# Patient Record
Sex: Female | Born: 1955
Health system: Southern US, Community
[De-identification: ages and names within clinical notes are randomized; demographics above are authoritative.]

## PROBLEM LIST (undated history)

## (undated) DIAGNOSIS — K635 Polyp of colon: Secondary | ICD-10-CM

## (undated) DIAGNOSIS — F419 Anxiety disorder, unspecified: Secondary | ICD-10-CM

## (undated) DIAGNOSIS — I10 Essential (primary) hypertension: Secondary | ICD-10-CM

## (undated) DIAGNOSIS — R011 Cardiac murmur, unspecified: Secondary | ICD-10-CM

## (undated) DIAGNOSIS — F32A Depression, unspecified: Secondary | ICD-10-CM

## (undated) DIAGNOSIS — T7840XA Allergy, unspecified, initial encounter: Secondary | ICD-10-CM

## (undated) DIAGNOSIS — M8580 Other specified disorders of bone density and structure, unspecified site: Secondary | ICD-10-CM

## (undated) DIAGNOSIS — Z860100 Personal history of colon polyps, unspecified: Secondary | ICD-10-CM

## (undated) DIAGNOSIS — G43909 Migraine, unspecified, not intractable, without status migrainosus: Secondary | ICD-10-CM

## (undated) DIAGNOSIS — J301 Allergic rhinitis due to pollen: Secondary | ICD-10-CM

## (undated) DIAGNOSIS — K219 Gastro-esophageal reflux disease without esophagitis: Secondary | ICD-10-CM

## (undated) DIAGNOSIS — R51 Headache: Secondary | ICD-10-CM

## (undated) DIAGNOSIS — D219 Benign neoplasm of connective and other soft tissue, unspecified: Secondary | ICD-10-CM

## (undated) DIAGNOSIS — R519 Headache, unspecified: Secondary | ICD-10-CM

## (undated) DIAGNOSIS — F329 Major depressive disorder, single episode, unspecified: Secondary | ICD-10-CM

## (undated) HISTORY — PX: OTHER SURGICAL HISTORY: SHX169

## (undated) HISTORY — PX: FOOT SURGERY: SHX648

## (undated) HISTORY — DX: Migraine, unspecified, not intractable, without status migrainosus: G43.909

## (undated) HISTORY — DX: Allergy, unspecified, initial encounter: T78.40XA

## (undated) HISTORY — DX: Personal history of colon polyps, unspecified: Z86.0100

## (undated) HISTORY — DX: Benign neoplasm of connective and other soft tissue, unspecified: D21.9

## (undated) HISTORY — PX: BREAST BIOPSY: SHX20

## (undated) HISTORY — DX: Personal history of colonic polyps: Z86.010

## (undated) HISTORY — DX: Cardiac murmur, unspecified: R01.1

## (undated) HISTORY — DX: Other specified disorders of bone density and structure, unspecified site: M85.80

## (undated) HISTORY — DX: Allergic rhinitis due to pollen: J30.1

## (undated) HISTORY — DX: Polyp of colon: K63.5

---

## 1997-09-05 HISTORY — PX: ABDOMINAL HYSTERECTOMY: SHX81

## 2004-10-22 ENCOUNTER — Ambulatory Visit: Payer: Self-pay | Admitting: Internal Medicine

## 2005-01-19 ENCOUNTER — Ambulatory Visit (HOSPITAL_COMMUNITY): Admission: RE | Admit: 2005-01-19 | Discharge: 2005-01-19 | Payer: Self-pay | Admitting: Sports Medicine

## 2005-03-10 ENCOUNTER — Ambulatory Visit: Payer: Self-pay | Admitting: Internal Medicine

## 2005-03-24 ENCOUNTER — Ambulatory Visit: Payer: Self-pay | Admitting: Internal Medicine

## 2005-04-29 ENCOUNTER — Ambulatory Visit: Payer: Self-pay | Admitting: Internal Medicine

## 2005-10-24 ENCOUNTER — Ambulatory Visit: Payer: Self-pay | Admitting: *Deleted

## 2005-11-21 ENCOUNTER — Ambulatory Visit: Payer: Self-pay | Admitting: Family Medicine

## 2006-11-21 ENCOUNTER — Ambulatory Visit: Payer: Self-pay | Admitting: Family Medicine

## 2006-12-27 ENCOUNTER — Ambulatory Visit: Payer: Self-pay | Admitting: Family Medicine

## 2006-12-27 LAB — CONVERTED CEMR LAB
ALT: 14 units/L (ref 0–40)
Albumin: 3.5 g/dL (ref 3.5–5.2)
Alkaline Phosphatase: 66 units/L (ref 39–117)
BUN: 12 mg/dL (ref 6–23)
Basophils Absolute: 0 10*3/uL (ref 0.0–0.1)
Bilirubin, Direct: 0.1 mg/dL (ref 0.0–0.3)
CO2: 34 meq/L — ABNORMAL HIGH (ref 19–32)
Calcium: 9.2 mg/dL (ref 8.4–10.5)
Eosinophils Absolute: 0.1 10*3/uL (ref 0.0–0.6)
Eosinophils Relative: 2.7 % (ref 0.0–5.0)
GFR calc Af Amer: 98 mL/min
Glucose, Bld: 89 mg/dL (ref 70–99)
HDL: 72.2 mg/dL (ref >39.0)
MCV: 90.3 fL (ref 78.0–100.0)
Monocytes Absolute: 0.5 10*3/uL (ref 0.2–0.7)
RBC: 4.38 M/uL (ref 3.87–5.11)
TSH: 0.49 microintl units/mL (ref 0.35–5.50)
Total Bilirubin: 0.7 mg/dL (ref 0.3–1.2)
Total CHOL/HDL Ratio: 2.4
Total Protein: 6.5 g/dL (ref 6.0–8.3)
VLDL: 18 mg/dL (ref 0–40)

## 2007-01-11 ENCOUNTER — Ambulatory Visit: Payer: Self-pay | Admitting: Family Medicine

## 2007-01-11 ENCOUNTER — Ambulatory Visit: Payer: Self-pay

## 2007-01-22 DIAGNOSIS — F341 Dysthymic disorder: Secondary | ICD-10-CM

## 2007-02-05 ENCOUNTER — Ambulatory Visit: Payer: Self-pay

## 2007-02-05 ENCOUNTER — Encounter: Payer: Self-pay | Admitting: Family Medicine

## 2007-02-08 ENCOUNTER — Encounter (INDEPENDENT_AMBULATORY_CARE_PROVIDER_SITE_OTHER): Payer: Self-pay | Admitting: *Deleted

## 2007-03-19 ENCOUNTER — Ambulatory Visit: Payer: Self-pay | Admitting: Family Medicine

## 2007-03-19 DIAGNOSIS — I10 Essential (primary) hypertension: Secondary | ICD-10-CM

## 2007-03-19 DIAGNOSIS — R51 Headache: Secondary | ICD-10-CM

## 2007-03-19 DIAGNOSIS — F411 Generalized anxiety disorder: Secondary | ICD-10-CM | POA: Insufficient documentation

## 2007-03-19 DIAGNOSIS — R519 Headache, unspecified: Secondary | ICD-10-CM | POA: Insufficient documentation

## 2007-03-21 ENCOUNTER — Ambulatory Visit: Payer: Self-pay | Admitting: Family Medicine

## 2007-03-22 LAB — CONVERTED CEMR LAB
BUN: 9 mg/dL (ref 6–23)
Calcium: 9.7 mg/dL (ref 8.4–10.5)
Chloride: 103 meq/L (ref 96–112)
Glucose, Bld: 84 mg/dL (ref 70–99)
Potassium: 3.6 meq/L (ref 3.5–5.1)

## 2007-07-12 ENCOUNTER — Telehealth (INDEPENDENT_AMBULATORY_CARE_PROVIDER_SITE_OTHER): Payer: Self-pay | Admitting: *Deleted

## 2013-01-09 ENCOUNTER — Other Ambulatory Visit: Payer: Self-pay | Admitting: Geriatric Medicine

## 2013-01-09 MED ORDER — PREGABALIN 25 MG PO CAPS: 25.0000 mg | ORAL_CAPSULE | Freq: Two times a day (BID) | ORAL | Status: DC

## 2015-01-01 ENCOUNTER — Encounter: Payer: Self-pay | Admitting: Internal Medicine

## 2015-12-16 ENCOUNTER — Telehealth (HOSPITAL_COMMUNITY): Payer: Self-pay | Admitting: *Deleted

## 2015-12-16 NOTE — Telephone Encounter (Signed)
Telephoned patient at home # and left message to return call to [facility] 

## 2015-12-29 ENCOUNTER — Telehealth (HOSPITAL_COMMUNITY): Payer: Self-pay | Admitting: *Deleted

## 2015-12-29 NOTE — Telephone Encounter (Signed)
Telephoned patient at home # and left message to return call to [facility] 

## 2016-01-04 ENCOUNTER — Other Ambulatory Visit (HOSPITAL_COMMUNITY): Payer: Self-pay | Admitting: *Deleted

## 2016-01-04 DIAGNOSIS — N6320 Unspecified lump in the left breast, unspecified quadrant: Secondary | ICD-10-CM

## 2016-01-21 ENCOUNTER — Encounter (HOSPITAL_COMMUNITY): Payer: Self-pay

## 2016-01-21 ENCOUNTER — Ambulatory Visit
Admission: RE | Admit: 2016-01-21 | Discharge: 2016-01-21 | Disposition: A | Discharge status: Home / Self Care | Payer: No Typology Code available for payment source | Source: Ambulatory Visit | Attending: Obstetrics and Gynecology | Admitting: Obstetrics and Gynecology

## 2016-01-21 ENCOUNTER — Other Ambulatory Visit (HOSPITAL_COMMUNITY): Payer: Self-pay | Admitting: Obstetrics and Gynecology

## 2016-01-21 ENCOUNTER — Ambulatory Visit (HOSPITAL_COMMUNITY)
Admission: RE | Admit: 2016-01-21 | Discharge: 2016-01-21 | Disposition: A | Discharge status: Home / Self Care | Payer: Self-pay | Source: Ambulatory Visit | Attending: Obstetrics and Gynecology | Admitting: Obstetrics and Gynecology

## 2016-01-21 VITALS — BP 142/100 | Temp 98.1°F | Ht 64.0 in | Wt 127.4 lb

## 2016-01-21 DIAGNOSIS — N6320 Unspecified lump in the left breast, unspecified quadrant: Secondary | ICD-10-CM

## 2016-01-21 DIAGNOSIS — N6324 Unspecified lump in the left breast, lower inner quadrant: Secondary | ICD-10-CM

## 2016-01-21 DIAGNOSIS — Z1239 Encounter for other screening for malignant neoplasm of breast: Secondary | ICD-10-CM

## 2016-01-21 DIAGNOSIS — N6310 Unspecified lump in the right breast, unspecified quadrant: Secondary | ICD-10-CM

## 2016-01-21 HISTORY — DX: Essential (primary) hypertension: I10

## 2016-01-21 NOTE — Patient Instructions (Signed)
Educational materials on self breast awareness given. Explained to Casey Frazier that she did not need a Pap smear today due to her history of a hysterectomy for benign reasons. Told patient that she does not need any further Pap smears due to her history of a hysterectomy for benign reasons. Referred patient to the [facility] for diagnostic mammogram. Appointment scheduled for Thursday, Jan 21, 2016 at 1410. Casey Frazier verbalized understanding.  Sola Margolis, Arvil Chaco, RN 2:10 PM

## 2016-01-21 NOTE — Progress Notes (Signed)
Complaints of a left breast lump x 10-15 years that hasn't increased in size. Patient states she thinks is has decreased in size.  Pap Smear:  Pap smear not completed today. Last Pap smear was in 2016 at Dr. Bettey Mare office and normal. Per patient has no history of an abnormal Pap smear. Patient has a history of a hysterectomy for fibroids in 1998. Patient no longer needs Pap smears due to her history of a hysterectomy for benign reasons per [facility] and ACOG guidelines. No Pap smear results are in EPIC.  Physical exam: Breasts Breasts symmetrical. No skin abnormalities bilateral breasts. No nipple retraction bilateral breasts. No nipple discharge bilateral breasts. No lymphadenopathy. No lumps palpated right breast. Palpated a lump within the left breast at 7 o'clock next to areola. No complaints of pain or tenderness on exam. Referred patient to the [facility] for diagnostic mammogram. Appointment scheduled for Thursday, Jan 21, 2016 at 1410.   Pelvic/Bimanual No Pap smear completed today since patient has a history of a hysterectomy for benign reasons. Pap smear not indicated per [facility] guidelines.   Smoking History: Patient has never smoked.  Patient Navigation: Patient education provided. Access to services provided for patient through [facility] program.   Colorectal Cancer Screening: Patient had a colonoscopy completed 03/24/2005. No complaints today.

## 2016-02-05 ENCOUNTER — Encounter (HOSPITAL_COMMUNITY): Payer: Self-pay | Admitting: *Deleted

## 2016-02-12 ENCOUNTER — Other Ambulatory Visit (HOSPITAL_COMMUNITY): Payer: Self-pay | Admitting: Obstetrics and Gynecology

## 2016-02-12 DIAGNOSIS — N6320 Unspecified lump in the left breast, unspecified quadrant: Secondary | ICD-10-CM

## 2016-02-15 ENCOUNTER — Ambulatory Visit
Admission: RE | Admit: 2016-02-15 | Discharge: 2016-02-15 | Disposition: A | Discharge status: Home / Self Care | Payer: No Typology Code available for payment source | Source: Ambulatory Visit | Attending: Obstetrics and Gynecology | Admitting: Obstetrics and Gynecology

## 2016-02-15 ENCOUNTER — Other Ambulatory Visit: Payer: No Typology Code available for payment source

## 2016-02-15 DIAGNOSIS — N6320 Unspecified lump in the left breast, unspecified quadrant: Secondary | ICD-10-CM

## 2016-11-10 ENCOUNTER — Other Ambulatory Visit: Payer: Self-pay | Admitting: Obstetrics and Gynecology

## 2016-11-10 DIAGNOSIS — N6320 Unspecified lump in the left breast, unspecified quadrant: Secondary | ICD-10-CM

## 2016-11-15 ENCOUNTER — Ambulatory Visit
Admission: RE | Admit: 2016-11-15 | Discharge: 2016-11-15 | Disposition: A | Discharge status: Home / Self Care | Payer: BLUE CROSS/BLUE SHIELD | Source: Ambulatory Visit | Attending: Obstetrics and Gynecology | Admitting: Obstetrics and Gynecology

## 2016-11-15 DIAGNOSIS — N6320 Unspecified lump in the left breast, unspecified quadrant: Secondary | ICD-10-CM

## 2016-11-15 DIAGNOSIS — N6489 Other specified disorders of breast: Secondary | ICD-10-CM | POA: Diagnosis not present

## 2016-11-15 DIAGNOSIS — R928 Other abnormal and inconclusive findings on diagnostic imaging of breast: Secondary | ICD-10-CM | POA: Diagnosis not present

## 2017-02-01 ENCOUNTER — Telehealth: Payer: Self-pay

## 2017-02-01 NOTE — Telephone Encounter (Signed)
Received 116 pages of records, forwarded to [facility].

## 2017-02-08 ENCOUNTER — Encounter: Payer: Self-pay | Admitting: Gastroenterology

## 2017-02-08 ENCOUNTER — Telehealth: Payer: Self-pay | Admitting: Gastroenterology

## 2017-02-08 NOTE — Telephone Encounter (Signed)
Received GI records through the mail. Dr. Loletha Carrow is Doc of the Day for 02/08/17.   Former Dr. Olevia Perches patient. Patient states that she has moved back to the area and would like to re establish with our office. Records placed on Dr. Corena Pilgrim desk for review.

## 2017-02-08 NOTE — Telephone Encounter (Signed)
Dr Loletha Carrow has accepted patient. Ok to schedule OV. Appointment scheduled.

## 2017-03-20 ENCOUNTER — Encounter (INDEPENDENT_AMBULATORY_CARE_PROVIDER_SITE_OTHER): Payer: Self-pay

## 2017-03-20 ENCOUNTER — Other Ambulatory Visit (INDEPENDENT_AMBULATORY_CARE_PROVIDER_SITE_OTHER): Payer: BLUE CROSS/BLUE SHIELD

## 2017-03-20 ENCOUNTER — Ambulatory Visit (INDEPENDENT_AMBULATORY_CARE_PROVIDER_SITE_OTHER): Payer: BLUE CROSS/BLUE SHIELD | Admitting: Gastroenterology

## 2017-03-20 ENCOUNTER — Telehealth: Payer: Self-pay | Admitting: Gastroenterology

## 2017-03-20 ENCOUNTER — Encounter: Payer: Self-pay | Admitting: Gastroenterology

## 2017-03-20 VITALS — BP 150/100 | HR 56 | Ht 60.75 in | Wt 138.1 lb

## 2017-03-20 DIAGNOSIS — R1032 Left lower quadrant pain: Secondary | ICD-10-CM

## 2017-03-20 DIAGNOSIS — R1310 Dysphagia, unspecified: Secondary | ICD-10-CM

## 2017-03-20 DIAGNOSIS — R197 Diarrhea, unspecified: Secondary | ICD-10-CM

## 2017-03-20 DIAGNOSIS — R1319 Other dysphagia: Secondary | ICD-10-CM

## 2017-03-20 LAB — CBC WITH DIFFERENTIAL/PLATELET
BASOS PCT: 1.4 % (ref 0.0–3.0)
Basophils Absolute: 0.1 10*3/uL (ref 0.0–0.1)
EOS ABS: 0.2 10*3/uL (ref 0.0–0.7)
Eosinophils Relative: 2.4 % (ref 0.0–5.0)
HCT: 40.6 % (ref 36.0–46.0)
Hemoglobin: 13.6 g/dL (ref 12.0–15.0)
Lymphocytes Relative: 44.7 % (ref 12.0–46.0)
Lymphs Abs: 4.3 10*3/uL — ABNORMAL HIGH (ref 0.7–4.0)
MCHC: 33.4 g/dL (ref 30.0–36.0)
MCV: 90.6 fl (ref 78.0–100.0)
MONO ABS: 1.1 10*3/uL — AB (ref 0.1–1.0)
Monocytes Relative: 11.2 % (ref 3.0–12.0)
NEUTROS ABS: 3.9 10*3/uL (ref 1.4–7.7)
NEUTROS PCT: 40.3 % — AB (ref 43.0–77.0)
PLATELETS: 270 10*3/uL (ref 150.0–400.0)
RBC: 4.48 Mil/uL (ref 3.87–5.11)
RDW: 12.6 % (ref 11.5–15.5)
WBC: 9.6 10*3/uL (ref 4.0–10.5)

## 2017-03-20 LAB — COMPREHENSIVE METABOLIC PANEL
ALT: 11 U/L (ref 0–35)
AST: 14 U/L (ref 0–37)
Albumin: 4 g/dL (ref 3.5–5.2)
Alkaline Phosphatase: 70 U/L (ref 39–117)
BUN: 11 mg/dL (ref 6–23)
CHLORIDE: 104 meq/L (ref 96–112)
CO2: 29 meq/L (ref 19–32)
CREATININE: 1.02 mg/dL (ref 0.40–1.20)
Calcium: 9.8 mg/dL (ref 8.4–10.5)
GFR: 58.63 mL/min — ABNORMAL LOW (ref >60.00)
Glucose, Bld: 91 mg/dL (ref 70–99)
Potassium: 4.4 mEq/L (ref 3.5–5.1)
SODIUM: 140 meq/L (ref 135–145)
Total Bilirubin: 0.3 mg/dL (ref 0.2–1.2)
Total Protein: 7 g/dL (ref 6.0–8.3)

## 2017-03-20 NOTE — Patient Instructions (Signed)
If you are age 60 or older, your body mass index should be between 23-30. Your Body mass index is 26.31 kg/m. If this is out of the aforementioned range listed, please consider follow up with your Primary Care Provider.  If you are age 40 or younger, your body mass index should be between 19-25. Your Body mass index is 26.31 kg/m. If this is out of the aformentioned range listed, please consider follow up with your Primary Care Provider.   It has been recommended to you by your physician that you have a(n) Colonoscopy completed. Per your request, we did not schedule the procedure(s) today. Please contact our office at [phone] should you decide to have the procedure completed.  Your physician has requested that you go to the basement for lab work before leaving today.  Thank you for choosing [facility]  Dr Wilfrid Lund III

## 2017-03-20 NOTE — Progress Notes (Signed)
[facility] Consult Note:  History: Casey Frazier 03/20/2017  Referring physician: Colon Branch, MD  Reason for consult/chief complaint: Abdominal Pain (epigastric/upper abd pain, food going down slow and feels like it wants to stop in upper abd) and Gastroesophageal Reflux   Subjective  HPI:  This is a 61 year old woman self-referred for abdominal pain and dysphagia. She reports that for about the last month she has had some intermittent left lower quadrant pain and stools are loose, both of which are change for her. There's been no rectal bleeding. She also describes feelings of reflux and as if food "is not passing". This seems to mean that it feels as if it is hanging up in the chest. There's been no nausea or vomiting. She has been under significant stress lately with the loss of her job ended upcoming move. She also had to stop her Effexor due to cost. She has taken no over-the-counter or prescription meds for these symptoms, and in fact has not seen primary care since at least sometime last year. A large amount of primary care notes dating back almost 8 years were reviewed today. The only information pertinent to her complaints today were her colonoscopy report in 2014 and a letter she received regarding the pathology results. She had a normal colonoscopy with Dr. Olevia Perches at this clinic in 2006, and her colonoscopy in [place] revealed a hamartomatous polyp in 2014.  ROS:  Review of Systems  Constitutional: Negative for appetite change and unexpected weight change.  HENT: Negative for mouth sores and voice change.   Eyes: Negative for pain and redness.  Respiratory: Negative for cough and shortness of breath.   Cardiovascular: Negative for chest pain and palpitations.  Genitourinary: Negative for dysuria and hematuria.  Musculoskeletal: Negative for arthralgias and myalgias.  Skin: Negative for pallor and rash.  Neurological: Negative for weakness and  headaches.  Hematological: Negative for adenopathy.  Psychiatric/Behavioral: The patient is nervous/anxious.      Past Medical History: Past Medical History:  Diagnosis Date  . Colon polyps   . Hypertension      Past Surgical History: Past Surgical History:  Procedure Laterality Date  . ABDOMINAL HYSTERECTOMY    . BREAST BIOPSY Left      Family History: Family History  Problem Relation Age of Onset  . Breast cancer Mother        mets  . Hypertension Mother   . Lung cancer Mother   . Hypertension Maternal Aunt   . Liver cancer Father        probable  . Ulcerative colitis Sister   . Clotting disorder Paternal Grandmother   . Clotting disorder Paternal Uncle     Social History: Social History   Social History  . Marital status: Single    Spouse name: N/A  . Number of children: 0  . Years of education: N/A   Social History Main Topics  . Smoking status: Former Smoker    Quit date: 09/05/1986  . Smokeless tobacco: Never Used     Comment: college years  . Alcohol use No  . Drug use: No  . Sexual activity: Yes    Birth control/ protection: Surgical   Other Topics Concern  . None   Social History Narrative  . None    Allergies: No Known Allergies  Outpatient Meds: Current Outpatient Prescriptions  Medication Sig Dispense Refill  . estrogens, conjugated, (PREMARIN) 0.625 MG tablet Take 0.625 mg by mouth daily. Take daily for 21  days then do not take for 7 days.    Marland Kitchen lisinopril (PRINIVIL,ZESTRIL) 40 MG tablet Take 40 mg by mouth daily.    . propranolol (INDERAL) 20 MG tablet Take 20 mg by mouth 2 (two) times daily.     No current facility-administered medications for this visit.       ___________________________________________________________________ Objective   Exam:  BP (!) 150/100 (BP Location: Left Arm, Patient Position: Sitting, Cuff Size: Normal)   Pulse (!) 56   Ht 5' 0.75" (1.543 m) Comment: height measured without shoes  Wt 138 lb 2  oz (62.7 kg)   BMI 26.31 kg/m    General: this is a(n) pleasant and well-appearing woman, normal muscle mass   Eyes: sclera anicteric, no redness  ENT: oral mucosa moist without lesions, no cervical or supraclavicular lymphadenopathy, good dentition  CV: RRR without murmur, S1/S2, no JVD, no peripheral edema  Resp: clear to auscultation bilaterally, normal RR and effort noted  GI: soft, no tenderness, with active bowel sounds. No guarding or palpable organomegaly noted.  Skin; warm and dry, no rash or jaundice noted  Neuro: awake, alert and oriented x 3. Normal gross motor function and fluent speech  No recent data  Assessment: Encounter Diagnoses  Name Primary?  . Esophageal dysphagia Yes  . LLQ abdominal pain   . Diarrhea, unspecified type     Cause of symptoms is unclear. Could be some recent stress induced functional abdominal pain. I'm bothered that she has feelings of food feeling stuck in the chest. She was concerned because her father developed colorectal cancer in his 44s, but I do not think these symptoms are consistent with colon cancer. I advised her to have an EGD and colonoscopy, but she is not sure if she can do that since she loses insurance at the end of this month. I offered her procedure slots at the end of this week, but she is not sure if she can get a ride. We have asked her to contact us in the next day or 2 to let us know if she can work out the ride issues. If so, we will get her on the schedule as it allows.   Thank you for the courtesy of this consult.  Please call me with any questions or concerns.  Nelida Meuse III  CC: Colon Branch, MD

## 2017-03-21 NOTE — Telephone Encounter (Signed)
Patient calling back regarding this.  °

## 2017-03-22 ENCOUNTER — Other Ambulatory Visit: Payer: Self-pay

## 2017-03-22 ENCOUNTER — Encounter (HOSPITAL_COMMUNITY): Payer: Self-pay | Admitting: *Deleted

## 2017-03-22 DIAGNOSIS — Z1211 Encounter for screening for malignant neoplasm of colon: Secondary | ICD-10-CM

## 2017-03-22 MED ORDER — NA SULFATE-K SULFATE-MG SULF 17.5-3.13-1.6 GM/177ML PO SOLN: 1.0000 | Freq: Once | ORAL | 0 refills | Status: AC

## 2017-03-22 NOTE — Telephone Encounter (Addendum)
Pt has been scheduled for a colonoscopy/EGD on 03-24-2017 at [facility].

## 2017-03-23 DIAGNOSIS — M609 Myositis, unspecified: Secondary | ICD-10-CM | POA: Insufficient documentation

## 2017-03-24 ENCOUNTER — Ambulatory Visit (HOSPITAL_COMMUNITY): Payer: BLUE CROSS/BLUE SHIELD | Admitting: Certified Registered Nurse Anesthetist

## 2017-03-24 ENCOUNTER — Encounter (HOSPITAL_COMMUNITY)
Admission: RE | Disposition: A | Discharge status: Home / Self Care | Payer: Self-pay | Source: Ambulatory Visit | Attending: Gastroenterology

## 2017-03-24 ENCOUNTER — Ambulatory Visit (HOSPITAL_COMMUNITY)
Admission: RE | Admit: 2017-03-24 | Discharge: 2017-03-24 | Disposition: A | Discharge status: Home / Self Care | Payer: BLUE CROSS/BLUE SHIELD | Source: Ambulatory Visit | Attending: Gastroenterology | Admitting: Gastroenterology

## 2017-03-24 ENCOUNTER — Encounter (HOSPITAL_COMMUNITY): Payer: Self-pay | Admitting: Certified Registered Nurse Anesthetist

## 2017-03-24 DIAGNOSIS — K5730 Diverticulosis of large intestine without perforation or abscess without bleeding: Secondary | ICD-10-CM | POA: Diagnosis not present

## 2017-03-24 DIAGNOSIS — I10 Essential (primary) hypertension: Secondary | ICD-10-CM | POA: Diagnosis not present

## 2017-03-24 DIAGNOSIS — Z86010 Personal history of colonic polyps: Secondary | ICD-10-CM | POA: Diagnosis not present

## 2017-03-24 DIAGNOSIS — K219 Gastro-esophageal reflux disease without esophagitis: Secondary | ICD-10-CM | POA: Insufficient documentation

## 2017-03-24 DIAGNOSIS — R197 Diarrhea, unspecified: Secondary | ICD-10-CM

## 2017-03-24 DIAGNOSIS — Z87891 Personal history of nicotine dependence: Secondary | ICD-10-CM | POA: Diagnosis not present

## 2017-03-24 DIAGNOSIS — R1013 Epigastric pain: Secondary | ICD-10-CM

## 2017-03-24 DIAGNOSIS — R1310 Dysphagia, unspecified: Secondary | ICD-10-CM | POA: Insufficient documentation

## 2017-03-24 DIAGNOSIS — R1032 Left lower quadrant pain: Secondary | ICD-10-CM | POA: Diagnosis not present

## 2017-03-24 DIAGNOSIS — Z79899 Other long term (current) drug therapy: Secondary | ICD-10-CM | POA: Insufficient documentation

## 2017-03-24 DIAGNOSIS — Z1211 Encounter for screening for malignant neoplasm of colon: Secondary | ICD-10-CM

## 2017-03-24 HISTORY — PX: COLONOSCOPY WITH PROPOFOL: SHX5780

## 2017-03-24 HISTORY — DX: Major depressive disorder, single episode, unspecified: F32.9

## 2017-03-24 HISTORY — DX: Headache, unspecified: R51.9

## 2017-03-24 HISTORY — DX: Gastro-esophageal reflux disease without esophagitis: K21.9

## 2017-03-24 HISTORY — DX: Depression, unspecified: F32.A

## 2017-03-24 HISTORY — PX: ESOPHAGOGASTRODUODENOSCOPY (EGD) WITH PROPOFOL: SHX5813

## 2017-03-24 HISTORY — DX: Anxiety disorder, unspecified: F41.9

## 2017-03-24 HISTORY — DX: Headache: R51

## 2017-03-24 SURGERY — COLONOSCOPY WITH PROPOFOL
Anesthesia: Monitor Anesthesia Care

## 2017-03-24 MED ORDER — PROPOFOL 10 MG/ML IV BOLUS
INTRAVENOUS | Status: DC | PRN
  Administered 2017-03-24 (×8): 20 mg via INTRAVENOUS

## 2017-03-24 MED ORDER — ONDANSETRON HCL 4 MG/2ML IJ SOLN
INTRAMUSCULAR | Status: DC | PRN
  Administered 2017-03-24: 4 mg via INTRAVENOUS

## 2017-03-24 MED ORDER — PROPOFOL 500 MG/50ML IV EMUL
INTRAVENOUS | Status: DC | PRN
  Administered 2017-03-24: 100 ug/kg/min via INTRAVENOUS

## 2017-03-24 MED ORDER — LACTATED RINGERS IV SOLN
INTRAVENOUS | Status: DC
  Administered 2017-03-24: 09:00:00 via INTRAVENOUS

## 2017-03-24 MED ORDER — LIDOCAINE 2% (20 MG/ML) 5 ML SYRINGE
INTRAMUSCULAR | Status: DC | PRN
  Administered 2017-03-24: 100 mg via INTRAVENOUS

## 2017-03-24 MED ORDER — PROPOFOL 10 MG/ML IV BOLUS
INTRAVENOUS | Status: AC
  Filled 2017-03-24: qty 40

## 2017-03-24 MED ORDER — LIDOCAINE 2% (20 MG/ML) 5 ML SYRINGE
INTRAMUSCULAR | Status: AC
  Filled 2017-03-24: qty 5

## 2017-03-24 MED ORDER — SODIUM CHLORIDE 0.9 % IV SOLN: INTRAVENOUS | Status: DC

## 2017-03-24 MED ORDER — ONDANSETRON HCL 4 MG/2ML IJ SOLN
INTRAMUSCULAR | Status: AC
  Filled 2017-03-24: qty 2

## 2017-03-24 SURGICAL SUPPLY — 24 items

## 2017-03-24 NOTE — Op Note (Signed)
[facility] Patient Name: Casey Frazier Procedure Date: 03/24/2017 MRN: [id number] Attending MD: Estill Cotta. Loletha Carrow , MD Date of Birth: [birth year] CSN: [id number] Age: 61 Admit Type: Outpatient Procedure:                Colonoscopy Indications:              Abdominal pain in the left lower quadrant,                            intermittent diarrhea Providers:                Mallie Mussel L. Loletha Carrow, MD, Lillie Fragmin, RN, Alan Mulder, Technician Referring MD:              Medicines:                Monitored Anesthesia Care Complications:            No immediate complications. Estimated Blood Loss:     Estimated blood loss: none. Procedure:                Pre-Anesthesia Assessment:                           - Prior to the procedure, a History and Physical                            was performed, and patient medications and                            allergies were reviewed. The patient's tolerance of                            previous anesthesia was also reviewed. The risks                            and benefits of the procedure and the sedation                            options and risks were discussed with the patient.                            All questions were answered, and informed consent                            was obtained. Prior Anticoagulants: The patient has                            taken no previous anticoagulant or antiplatelet                            agents. ASA Grade Assessment: II - A patient with                            mild  systemic disease. After reviewing the risks                            and benefits, the patient was deemed in                            satisfactory condition to undergo the procedure.                           After obtaining informed consent, the colonoscope                            was passed under direct vision. Throughout the                            procedure, the patient's blood  pressure, pulse, and                            oxygen saturations were monitored continuously. The                            EC-[id number] ([id number]) scope was introduced through                            the anus and advanced to the the terminal ileum.                            The colonoscopy was performed without difficulty.                            The patient tolerated the procedure well. The                            quality of the bowel preparation was good. The                            bowel preparation used was SUPREP. The quality of                            the bowel preparation was evaluated using the BBPS                            [facility] Bowel Preparation Scale) with scores of:                            Right Colon = 2, Transverse Colon = 2 and Left                            Colon = 2. The total BBPS score equals 6. The                            terminal ileum, ileocecal valve, appendiceal  orifice, and rectum were photographed. Scope In: 10:24:14 AM Scope Out: 10:37:34 AM Scope Withdrawal Time: 0 hours 8 minutes 6 seconds  Total Procedure Duration: 0 hours 13 minutes 20 seconds  Findings:      The perianal and digital rectal examinations were normal.      The terminal ileum appeared normal.      A few diverticula were found in the left colon and right colon.      Normal mucosa was found in the entire colon. Biopsies for histology were       taken with a cold forceps from the left colon for evaluation of       microscopic colitis.      The exam was otherwise without abnormality on direct and retroflexion       views. Impression:               - The examined portion of the ileum was normal.                           - Diverticulosis in the left colon and in the right                            colon.                           - Normal mucosa in the entire examined colon.                            Biopsied.                           -  The examination was otherwise normal on direct                            and retroflexion views. Moderate Sedation:      MAC sedation used Recommendation:           - Patient has a contact number available for                            emergencies. The signs and symptoms of potential                            delayed complications were discussed with the                            patient. Return to normal activities tomorrow.                            Written discharge instructions were provided to the                            patient.                           - Resume previous diet.                           -  Continue present medications.                           - Await pathology results.                           - Repeat colonoscopy in 5 years for screening due                            to family history of colon cancer in her father. Procedure Code(s):        --- Professional ---                           [phone], Colonoscopy, flexible; with biopsy, single                            or multiple Diagnosis Code(s):        --- Professional ---                           R10.32, Left lower quadrant pain                           R19.7, Diarrhea, unspecified                           K57.30, Diverticulosis of large intestine without                            perforation or abscess without bleeding CPT copyright 2016 American Medical Association. All rights reserved. The codes documented in this report are preliminary and upon coder review may  be revised to meet current compliance requirements. Clarinda Obi L. Loletha Carrow, MD 03/24/2017 10:49:37 AM This report has been signed electronically. Number of Addenda: 0

## 2017-03-24 NOTE — Anesthesia Preprocedure Evaluation (Addendum)
Anesthesia Evaluation  Patient identified by MRN, date of birth, ID band Patient awake    Reviewed: Allergy & Precautions, NPO status , Patient's Chart, lab work & pertinent test results  Airway Mallampati: II  TM Distance: >3 FB Neck ROM: Full    Dental no notable dental hx.    Pulmonary former smoker,    Pulmonary exam normal breath sounds clear to auscultation       Cardiovascular hypertension, Pt. on medications and Pt. on home beta blockers Normal cardiovascular exam Rhythm:Regular Rate:Normal     Neuro/Psych  Headaches, Anxiety Depression    GI/Hepatic Neg liver ROS, GERD  ,  Endo/Other  negative endocrine ROS  Renal/GU negative Renal ROS  negative genitourinary   Musculoskeletal negative musculoskeletal ROS (+)   Abdominal   Peds negative pediatric ROS (+)  Hematology negative hematology ROS (+)   Anesthesia Other Findings   Reproductive/Obstetrics negative OB ROS                             Anesthesia Physical Anesthesia Plan  ASA: II  Anesthesia Plan: MAC   Post-op Pain Management:    Induction: Intravenous  PONV Risk Score and Plan: 2 and Propofol and Treatment may vary due to age or medical condition  Airway Management Planned:   Additional Equipment:   Intra-op Plan:   Post-operative Plan:   Informed Consent: I have reviewed the patients History and Physical, chart, labs and discussed the procedure including the risks, benefits and alternatives for the proposed anesthesia with the patient or authorized representative who has indicated his/her understanding and acceptance.   Dental advisory given  Plan Discussed with: CRNA  Anesthesia Plan Comments:         Anesthesia Quick Evaluation

## 2017-03-24 NOTE — Op Note (Signed)
[facility] Patient Name: Casey Frazier Procedure Date: 03/24/2017 MRN: [id number] Attending MD: Estill Cotta. Loletha Carrow , MD Date of Birth: 1956-02-07 CSN: [id number] Age: 61 Admit Type: Outpatient Procedure:                Upper GI endoscopy Indications:              Epigastric abdominal pain, Dysphagia Providers:                Mallie Mussel L. Loletha Carrow, MD, Lillie Fragmin, RN, Alan Mulder, Technician Referring MD:              Medicines:                Monitored Anesthesia Care Complications:            No immediate complications. Estimated Blood Loss:     Estimated blood loss: none. Procedure:                Pre-Anesthesia Assessment:                           - Prior to the procedure, a History and Physical                            was performed, and patient medications and                            allergies were reviewed. The patient's tolerance of                            previous anesthesia was also reviewed. The risks                            and benefits of the procedure and the sedation                            options and risks were discussed with the patient.                            All questions were answered, and informed consent                            was obtained. Prior Anticoagulants: The patient has                            taken no previous anticoagulant or antiplatelet                            agents. ASA Grade Assessment: II - A patient with                            mild systemic disease. After reviewing the risks  and benefits, the patient was deemed in                            satisfactory condition to undergo the procedure.                           After obtaining informed consent, the endoscope was                            passed under direct vision. Throughout the                            procedure, the patient's blood pressure, pulse, and                            oxygen  saturations were monitored continuously. The                            Endoscope was introduced through the mouth, and                            advanced to the second part of duodenum. The upper                            GI endoscopy was accomplished without difficulty.                            The patient tolerated the procedure well. Scope In: Scope Out: Findings:      The esophagus was normal.      The stomach was normal.      The cardia and gastric fundus were normal on retroflexion.      The examined duodenum was normal. Impression:               - Normal esophagus.                           - Normal stomach.                           - Normal examined duodenum.                           - No specimens collected.                           These recent onset symptoms seem to be temporally                            related to some social stressors and the cessation                            of Effexor. Moderate Sedation:      MAC sedation used Recommendation:           - Patient has a contact number available for  emergencies. The signs and symptoms of potential                            delayed complications were discussed with the                            patient. Return to normal activities tomorrow.                            Written discharge instructions were provided to the                            patient.                           - Resume previous diet.                           - Continue present medications. Try ranitidine 150                            mg twice daily for the next 4 weeks to see if any                            improvement in the dysphagia, which can be related                            to GERD-induced dysmotility.                           - See the other procedure note for documentation of                            additional recommendations. Procedure Code(s):        --- Professional ---                            [phone], Esophagogastroduodenoscopy, flexible,                            transoral; diagnostic, including collection of                            specimen(s) by brushing or washing, when performed                            (separate procedure) Diagnosis Code(s):        --- Professional ---                           R13.10, Dysphagia, unspecified CPT copyright 2016 American Medical Association. All rights reserved. The codes documented in this report are preliminary and upon coder review may  be revised to meet current compliance requirements. Henry L. Loletha Carrow, MD 03/24/2017 10:45:51 AM This report has been signed electronically. Number of Addenda: 0

## 2017-03-24 NOTE — Interval H&P Note (Signed)
History and Physical Interval Note:  03/24/2017 9:23 AM  Casey Frazier  has presented today for surgery, with the diagnosis of Screening colonoscopy tt   The various methods of treatment have been discussed with the patient and family. After consideration of risks, benefits and other options for treatment, the patient has consented to  Procedure(s): COLONOSCOPY WITH PROPOFOL (N/A) ESOPHAGOGASTRODUODENOSCOPY (EGD) WITH PROPOFOL (N/A) as a surgical intervention .  The patient's history has been reviewed, patient examined, no change in status, stable for surgery.  I have reviewed the patient's chart and labs.  Questions were answered to the patient's satisfaction.     Nelida Meuse III

## 2017-03-24 NOTE — H&P (View-Only) (Signed)
[name] Consult Note:  History: Casey Frazier 03/20/2017  Referring physician: Colon Branch, MD  Reason for consult/chief complaint: Abdominal Pain (epigastric/upper abd pain, food going down slow and feels like it wants to stop in upper abd) and Gastroesophageal Reflux   Subjective  HPI:  This is a 61 year old woman self-referred for abdominal pain and dysphagia. She reports that for about the last month she has had some intermittent left lower quadrant pain and stools are loose, both of which are change for her. There's been no rectal bleeding. She also describes feelings of reflux and as if food "is not passing". This seems to mean that it feels as if it is hanging up in the chest. There's been no nausea or vomiting. She has been under significant stress lately with the loss of her job ended upcoming move. She also had to stop her Effexor due to cost. She has taken no over-the-counter or prescription meds for these symptoms, and in fact has not seen primary care since at least sometime last year. A large amount of primary care notes dating back almost 8 years were reviewed today. The only information pertinent to her complaints today were her colonoscopy report in 2014 and a letter she received regarding the pathology results. She had a normal colonoscopy with Dr. Olevia Perches at this clinic in 2006, and her colonoscopy in [place] revealed a hamartomatous polyp in 2014.  ROS:  Review of Systems  Constitutional: Negative for appetite change and unexpected weight change.  HENT: Negative for mouth sores and voice change.   Eyes: Negative for pain and redness.  Respiratory: Negative for cough and shortness of breath.   Cardiovascular: Negative for chest pain and palpitations.  Genitourinary: Negative for dysuria and hematuria.  Musculoskeletal: Negative for arthralgias and myalgias.  Skin: Negative for pallor and rash.  Neurological: Negative for weakness and  headaches.  Hematological: Negative for adenopathy.  Psychiatric/Behavioral: The patient is nervous/anxious.      Past Medical History: Past Medical History:  Diagnosis Date  . Colon polyps   . Hypertension      Past Surgical History: Past Surgical History:  Procedure Laterality Date  . ABDOMINAL HYSTERECTOMY    . BREAST BIOPSY Left      Family History: Family History  Problem Relation Age of Onset  . Breast cancer Mother        mets  . Hypertension Mother   . Lung cancer Mother   . Hypertension Maternal Aunt   . Liver cancer Father        probable  . Ulcerative colitis Sister   . Clotting disorder Paternal Grandmother   . Clotting disorder Paternal Uncle     Social History: Social History   Social History  . Marital status: Single    Spouse name: N/A  . Number of children: 0  . Years of education: N/A   Social History Main Topics  . Smoking status: Former Smoker    Quit date: 09/05/1986  . Smokeless tobacco: Never Used     Comment: college years  . Alcohol use No  . Drug use: No  . Sexual activity: Yes    Birth control/ protection: Surgical   Other Topics Concern  . None   Social History Narrative  . None    Allergies: No Known Allergies  Outpatient Meds: Current Outpatient Prescriptions  Medication Sig Dispense Refill  . estrogens, conjugated, (PREMARIN) 0.625 MG tablet Take 0.625 mg by mouth daily. Take daily for 21  days then do not take for 7 days.    Marland Kitchen lisinopril (PRINIVIL,ZESTRIL) 40 MG tablet Take 40 mg by mouth daily.    . propranolol (INDERAL) 20 MG tablet Take 20 mg by mouth 2 (two) times daily.     No current facility-administered medications for this visit.       ___________________________________________________________________ Objective   Exam:  BP (!) 150/100 (BP Location: Left Arm, Patient Position: Sitting, Cuff Size: Normal)   Pulse (!) 56   Ht 5' 0.75" (1.543 m) Comment: height measured without shoes  Wt 138 lb 2  oz (62.7 kg)   BMI 26.31 kg/m    General: this is a(n) pleasant and well-appearing woman, normal muscle mass   Eyes: sclera anicteric, no redness  ENT: oral mucosa moist without lesions, no cervical or supraclavicular lymphadenopathy, good dentition  CV: RRR without murmur, S1/S2, no JVD, no peripheral edema  Resp: clear to auscultation bilaterally, normal RR and effort noted  GI: soft, no tenderness, with active bowel sounds. No guarding or palpable organomegaly noted.  Skin; warm and dry, no rash or jaundice noted  Neuro: awake, alert and oriented x 3. Normal gross motor function and fluent speech  No recent data  Assessment: Encounter Diagnoses  Name Primary?  . Esophageal dysphagia Yes  . LLQ abdominal pain   . Diarrhea, unspecified type     Cause of symptoms is unclear. Could be some recent stress induced functional abdominal pain. I'm bothered that she has feelings of food feeling stuck in the chest. She was concerned because her father developed colorectal cancer in his 78s, but I do not think these symptoms are consistent with colon cancer. I advised her to have an EGD and colonoscopy, but she is not sure if she can do that since she loses insurance at the end of this month. I offered her procedure slots at the end of this week, but she is not sure if she can get a ride. We have asked her to contact us in the next day or 2 to let us know if she can work out the ride issues. If so, we will get her on the schedule as it allows.   Thank you for the courtesy of this consult.  Please call me with any questions or concerns.  Nelida Meuse III  CC: Colon Branch, MD

## 2017-03-24 NOTE — Discharge Instructions (Signed)
Colonoscopy, Adult, Care After °This sheet gives you information about how to care for yourself after your procedure. Your doctor may also give you more specific instructions. If you have problems or questions, call your doctor. °Follow these instructions at home: °General instructions ° °· For the first 24 hours after the procedure: °? Do not drive or use machinery. °? Do not sign important documents. °? Do not drink alcohol. °? Do your daily activities more slowly than normal. °? Eat foods that are soft and easy to digest. °? Rest often. °· Take over-the-counter or prescription medicines only as told by your doctor. °· It is up to you to get the results of your procedure. Ask your doctor, or the department performing the procedure, when your results will be ready. °To help cramping and bloating: °· Try walking around. °· Put heat on your belly (abdomen) as told by your doctor. Use a heat source that your doctor recommends, such as a moist heat pack or a heating pad. °? Put a towel between your skin and the heat source. °? Leave the heat on for 20-30 minutes. °? Remove the heat if your skin turns bright red. This is especially important if you cannot feel pain, heat, or cold. You can get burned. °Eating and drinking °· Drink enough fluid to keep your pee (urine) clear or pale yellow. °· Return to your normal diet as told by your doctor. Avoid heavy or fried foods that are hard to digest. °· Avoid drinking alcohol for as long as told by your doctor. °Contact a doctor if: °· You have blood in your poop (stool) 2-3 days after the procedure. °Get help right away if: °· You have more than a small amount of blood in your poop. °· You see large clumps of tissue (blood clots) in your poop. °· Your belly is swollen. °· You feel sick to your stomach (nauseous). °· You throw up (vomit). °· You have a fever. °· You have belly pain that gets worse, and medicine does not help your pain. °This information is not intended to  replace advice given to you by your health care provider. Make sure you discuss any questions you have with your health care provider. °Document Released: 09/24/2010 Document Revised: 05/16/2016 Document Reviewed: 05/16/2016 °Elsevier Interactive Patient Education © 2017 Elsevier Inc[employer] °Esophagogastroduodenoscopy, Care After °Refer to this sheet in the next few weeks. These instructions provide you with information about caring for yourself after your procedure. Your health care provider may also give you more specific instructions. Your treatment has been planned according to current medical practices, but problems sometimes occur. Call your health care provider if you have any problems or questions after your procedure. °What can I expect after the procedure? °After the procedure, it is common to have: °· A sore throat. °· Nausea. °· Bloating. °· Dizziness. °· Fatigue. ° °Follow these instructions at home: °· Do not eat or drink anything until the numbing medicine (local anesthetic) has worn off and your gag reflex has returned. You will know that the local anesthetic has worn off when you can swallow comfortably. °· Do not drive for 24 hours if you received a medicine to help you relax (sedative). °· If your health care provider took a tissue sample for testing during the procedure, make sure to get your test results. This is your responsibility. Ask your health care provider or the department performing the test when your results will be ready. °· Keep all follow-up visits as told   by your health care provider. This is important. °Contact a health care provider if: °· You cannot stop coughing. °· You are not urinating. °· You are urinating less than usual. °Get help right away if: °· You have trouble swallowing. °· You cannot eat or drink. °· You have throat or chest pain that gets worse. °· You are dizzy or light-headed. °· You faint. °· You have nausea or vomiting. °· You have chills. °· You have a fever. °· You  have severe abdominal pain. °· You have black, tarry, or bloody stools. °This information is not intended to replace advice given to you by your health care provider. Make sure you discuss any questions you have with your health care provider. °Document Released: 08/08/2012 Document Revised: 01/28/2016 Document Reviewed: 07/16/2015 °Elsevier Interactive Patient Education © 2018 Elsevier Inc. ° °

## 2017-03-24 NOTE — Anesthesia Postprocedure Evaluation (Signed)
Anesthesia Post Note  Patient: Casey Frazier  Procedure(s) Performed: Procedure(s) (LRB): COLONOSCOPY WITH PROPOFOL (N/A) ESOPHAGOGASTRODUODENOSCOPY (EGD) WITH PROPOFOL (N/A)     Patient location during evaluation: PACU Anesthesia Type: MAC Level of consciousness: awake and alert Pain management: pain level controlled Vital Signs Assessment: post-procedure vital signs reviewed and stable Respiratory status: spontaneous breathing, nonlabored ventilation, respiratory function stable and patient connected to nasal cannula oxygen Cardiovascular status: stable and blood pressure returned to baseline Anesthetic complications: no    Last Vitals:  Vitals:   03/24/17 1047 03/24/17 1050  BP: (!) 113/96 (!) 162/79  Pulse: (!) 54 68  Resp: 13 (!) 24  Temp:      Last Pain:  Vitals:   03/24/17 1047  TempSrc: Oral                 Ryan P Ellender

## 2017-03-24 NOTE — Transfer of Care (Signed)
Immediate Anesthesia Transfer of Care Note  Patient: Casey Frazier  Procedure(s) Performed: Procedure(s): COLONOSCOPY WITH PROPOFOL (N/A) ESOPHAGOGASTRODUODENOSCOPY (EGD) WITH PROPOFOL (N/A)  Patient Location: ENDO  Anesthesia Type:MAC  Level of Consciousness:  sedated, patient cooperative and responds to stimulation  Airway & Oxygen Therapy:Patient Spontanous Breathing and Patient connected to face mask oxgen  Post-op Assessment:  Report given to ENDO RN and Post -op Vital signs reviewed and stable  Post vital signs:  Reviewed and stable  Last Vitals:  Vitals:   03/24/17 0909  BP: (!) 188/82  Resp: 16  Temp: 46.5 C    Complications: No apparent anesthesia complications

## 2017-03-27 ENCOUNTER — Encounter (HOSPITAL_COMMUNITY): Payer: Self-pay | Admitting: Gastroenterology

## 2017-03-27 ENCOUNTER — Telehealth: Payer: Self-pay | Admitting: Internal Medicine

## 2017-03-27 NOTE — Telephone Encounter (Signed)
Not taking new patients at this point

## 2017-03-27 NOTE — Telephone Encounter (Signed)
°  Relation to [id number] Call back number: [phone]   Reason for call:  Patient would like to re establish with you, please advise

## 2017-03-27 NOTE — Telephone Encounter (Signed)
lvm advising patient of message below °

## 2017-03-29 ENCOUNTER — Encounter: Payer: Self-pay | Admitting: Behavioral Health

## 2017-03-29 ENCOUNTER — Telehealth: Payer: Self-pay | Admitting: Behavioral Health

## 2017-03-29 NOTE — Telephone Encounter (Signed)
Pre-Visit Call completed with patient and chart updated.   Pre-Visit Info documented in Specialty Comments under Snap[employer].    

## 2017-03-30 ENCOUNTER — Encounter: Payer: Self-pay | Admitting: Family Medicine

## 2017-03-30 ENCOUNTER — Ambulatory Visit (HOSPITAL_BASED_OUTPATIENT_CLINIC_OR_DEPARTMENT_OTHER)
Admission: RE | Admit: 2017-03-30 | Discharge: 2017-03-30 | Disposition: A | Discharge status: Home / Self Care | Payer: BLUE CROSS/BLUE SHIELD | Source: Ambulatory Visit | Attending: Family Medicine | Admitting: Family Medicine

## 2017-03-30 ENCOUNTER — Ambulatory Visit (INDEPENDENT_AMBULATORY_CARE_PROVIDER_SITE_OTHER): Payer: BLUE CROSS/BLUE SHIELD | Admitting: Family Medicine

## 2017-03-30 VITALS — BP 160/98 | HR 71 | Temp 98.1°F | Ht 60.75 in | Wt 133.6 lb

## 2017-03-30 DIAGNOSIS — F341 Dysthymic disorder: Secondary | ICD-10-CM | POA: Diagnosis not present

## 2017-03-30 DIAGNOSIS — R0989 Other specified symptoms and signs involving the circulatory and respiratory systems: Secondary | ICD-10-CM

## 2017-03-30 DIAGNOSIS — I6523 Occlusion and stenosis of bilateral carotid arteries: Secondary | ICD-10-CM | POA: Diagnosis not present

## 2017-03-30 DIAGNOSIS — I10 Essential (primary) hypertension: Secondary | ICD-10-CM

## 2017-03-30 MED ORDER — VENLAFAXINE HCL ER 75 MG PO CP24: 75.0000 mg | ORAL_CAPSULE | Freq: Every day | ORAL | 1 refills | Status: DC

## 2017-03-30 MED ORDER — LISINOPRIL 40 MG PO TABS: 40.0000 mg | ORAL_TABLET | Freq: Every day | ORAL | 1 refills | Status: DC

## 2017-03-30 MED ORDER — AMLODIPINE BESYLATE 5 MG PO TABS: 5.0000 mg | ORAL_TABLET | Freq: Every day | ORAL | 2 refills | Status: DC

## 2017-03-30 NOTE — Patient Instructions (Addendum)
Let me know if any of your medicine is too expensive.   DASH Eating Plan DASH stands for "Dietary Approaches to Stop Hypertension." The DASH eating plan is a healthy eating plan that has been shown to reduce high blood pressure (hypertension). It may also reduce your risk for type 2 diabetes, heart disease, and stroke. The DASH eating plan may also help with weight loss. What are tips for following this plan? General guidelines  Avoid eating more than 2,300 mg (milligrams) of salt (sodium) a day. If you have hypertension, you may need to reduce your sodium intake to 1,500 mg a day.  Limit alcohol intake to no more than 1 drink a day for nonpregnant women and 2 drinks a day for men. One drink equals 12 oz of beer, 5 oz of wine, or 1 oz of hard liquor.  Work with your health care provider to maintain a healthy body weight or to lose weight. Ask what an ideal weight is for you.  Get at least 30 minutes of exercise that causes your heart to beat faster (aerobic exercise) most days of the week. Activities may include walking, swimming, or biking.  Work with your health care provider or diet and nutrition specialist (dietitian) to adjust your eating plan to your individual calorie needs. Reading food labels  Check food labels for the amount of sodium per serving. Choose foods with less than 5 percent of the Daily Value of sodium. Generally, foods with less than 300 mg of sodium per serving fit into this eating plan.  To find whole grains, look for the word "whole" as the first word in the ingredient list. Shopping  Buy products labeled as "low-sodium" or "no salt added."  Buy fresh foods. Avoid canned foods and premade or frozen meals. Cooking  Avoid adding salt when cooking. Use salt-free seasonings or herbs instead of table salt or sea salt. Check with your health care provider or pharmacist before using salt substitutes.  Do not fry foods. Cook foods using healthy methods such as baking,  boiling, grilling, and broiling instead.  Cook with heart-healthy oils, such as olive, canola, soybean, or sunflower oil. Meal planning   Eat a balanced diet that includes: ? 5 or more servings of fruits and vegetables each day. At each meal, try to fill half of your plate with fruits and vegetables. ? Up to 6-8 servings of whole grains each day. ? Less than 6 oz of lean meat, poultry, or fish each day. A 3-oz serving of meat is about the same size as a deck of cards. One egg equals 1 oz. ? 2 servings of low-fat dairy each day. ? A serving of nuts, seeds, or beans 5 times each week. ? Heart-healthy fats. Healthy fats called Omega-3 fatty acids are found in foods such as flaxseeds and coldwater fish, like sardines, salmon, and mackerel.  Limit how much you eat of the following: ? Canned or prepackaged foods. ? Food that is high in trans fat, such as fried foods. ? Food that is high in saturated fat, such as fatty meat. ? Sweets, desserts, sugary drinks, and other foods with added sugar. ? Full-fat dairy products.  Do not salt foods before eating.  Try to eat at least 2 vegetarian meals each week.  Eat more home-cooked food and less restaurant, buffet, and fast food.  When eating at a restaurant, ask that your food be prepared with less salt or no salt, if possible. What foods are recommended? The items  listed may not be a complete list. Talk with your dietitian about what dietary choices are best for you. Grains Whole-grain or whole-wheat bread. Whole-grain or whole-wheat pasta. Brown rice. [place]row. Bulgur. Whole-grain and low-sodium cereals. Pita bread. Low-fat, low-sodium crackers. Whole-wheat flour tortillas. Vegetables Fresh or frozen vegetables (raw, steamed, roasted, or grilled). Low-sodium or reduced-sodium tomato and vegetable juice. Low-sodium or reduced-sodium tomato sauce and tomato paste. Low-sodium or reduced-sodium canned vegetables. Fruits All fresh, dried, or  frozen fruit. Canned fruit in natural juice (without added sugar). Meat and other protein foods Skinless chicken or [place]. Ground chicken or [place]. Pork with fat trimmed off. Fish and seafood. Egg whites. Dried beans, peas, or lentils. Unsalted nuts, nut butters, and seeds. Unsalted canned beans. Lean cuts of beef with fat trimmed off. Low-sodium, lean deli meat. Dairy Low-fat (1%) or fat-free (skim) milk. Fat-free, low-fat, or reduced-fat cheeses. Nonfat, low-sodium ricotta or cottage cheese. Low-fat or nonfat yogurt. Low-fat, low-sodium cheese. Fats and oils Soft margarine without trans fats. Vegetable oil. Low-fat, reduced-fat, or light mayonnaise and salad dressings (reduced-sodium). Canola, safflower, olive, soybean, and sunflower oils. Avocado. Seasoning and other foods Herbs. Spices. Seasoning mixes without salt. Unsalted popcorn and pretzels. Fat-free sweets. What foods are not recommended? The items listed may not be a complete list. Talk with your dietitian about what dietary choices are best for you. Grains Baked goods made with fat, such as croissants, muffins, or some breads. Dry pasta or rice meal packs. Vegetables Creamed or fried vegetables. Vegetables in a cheese sauce. Regular canned vegetables (not low-sodium or reduced-sodium). Regular canned tomato sauce and paste (not low-sodium or reduced-sodium). Regular tomato and vegetable juice (not low-sodium or reduced-sodium). Angie Fava. Olives. Fruits Canned fruit in a light or heavy syrup. Fried fruit. Fruit in cream or butter sauce. Meat and other protein foods Fatty cuts of meat. Ribs. Fried meat. Berniece Salines. Sausage. Bologna and other processed lunch meats. Salami. Fatback. Hotdogs. Bratwurst. Salted nuts and seeds. Canned beans with added salt. Canned or smoked fish. Whole eggs or egg yolks. Chicken or [place] with skin. Dairy Whole or 2% milk, cream, and half-and-half. Whole or full-fat cream cheese. Whole-fat or sweetened yogurt.  Full-fat cheese. Nondairy creamers. Whipped toppings. Processed cheese and cheese spreads. Fats and oils Butter. Stick margarine. Lard. Shortening. Ghee. Bacon fat. Tropical oils, such as coconut, palm kernel, or palm oil. Seasoning and other foods Salted popcorn and pretzels. Onion salt, garlic salt, seasoned salt, table salt, and sea salt. Worcestershire sauce. Tartar sauce. Barbecue sauce. Teriyaki sauce. Soy sauce, including reduced-sodium. Steak sauce. Canned and packaged gravies. Fish sauce. Oyster sauce. Cocktail sauce. Horseradish that you find on the shelf. Ketchup. Mustard. Meat flavorings and tenderizers. Bouillon cubes. Hot sauce and Tabasco sauce. Premade or packaged marinades. Premade or packaged taco seasonings. Relishes. Regular salad dressings. Where to find more information:  National Heart, Lung, and [place]: https://wilson-eaton.com/  American Heart Association: www.heart.org Summary  The DASH eating plan is a healthy eating plan that has been shown to reduce high blood pressure (hypertension). It may also reduce your risk for type 2 diabetes, heart disease, and stroke.  With the DASH eating plan, you should limit salt (sodium) intake to 2,300 mg a day. If you have hypertension, you may need to reduce your sodium intake to 1,500 mg a day.  When on the DASH eating plan, aim to eat more fresh fruits and vegetables, whole grains, lean proteins, low-fat dairy, and heart-healthy fats.  Work with your health care provider or  diet and nutrition specialist (dietitian) to adjust your eating plan to your individual calorie needs. This information is not intended to replace advice given to you by your health care provider. Make sure you discuss any questions you have with your health care provider. Document Released: 08/11/2011 Document Revised: 08/15/2016 Document Reviewed: 08/15/2016 Elsevier Interactive Patient Education  2017 [employer].

## 2017-03-30 NOTE — Progress Notes (Signed)
Chief Complaint  Patient presents with  . Establish Care    pt want to discuss medications       New Patient Visit SUBJECTIVE: HPI: Casey Frazier is an 61 y.o.female who is being seen for re-establishing care.  The patient was previously seen here but had moved away. She was recently told that she is losing her job and is now in the process of moving.   Hypertension Patient presents for hypertension follow up. She does not routinely monitor home blood pressures. She has run out of her medication-lisinopril 40 mg daily, propranolol 20 mg twice daily. Patient has these side effects of medication: none She is sometimes adhering to a healthy diet overall. Exercise: no scheduled exercise  Anxiety/Depression She has a history of anxiety and depression, treated with venlafaxine in the past. She is asking for a refill of this. Social stressors include losing her job and she is now moving to a more dangerous part of town. No self-medication. She is not following with a counselor or psychologist.   No Known Allergies  Past Medical History:  Diagnosis Date  . Allergy   . Anxiety   . Colon polyps   . Depression   . GERD (gastroesophageal reflux disease)   . Hayfever   . Headache    migraines  . Heart murmur   . History of colon polyps   . Hypertension   . Migraines    Past Surgical History:  Procedure Laterality Date  . ABDOMINAL HYSTERECTOMY  1999   complete  . BREAST BIOPSY Left    benign  . COLONOSCOPY WITH PROPOFOL N/A 03/24/2017   Procedure: COLONOSCOPY WITH PROPOFOL;  Surgeon: Doran Stabler, MD;  Location: [facility];  Service: Gastroenterology;  Laterality: N/A;  . colonscopy     with polyps removed  . ESOPHAGOGASTRODUODENOSCOPY (EGD) WITH PROPOFOL N/A 03/24/2017   Procedure: ESOPHAGOGASTRODUODENOSCOPY (EGD) WITH PROPOFOL;  Surgeon: Doran Stabler, MD;  Location: [facility];  Service: Gastroenterology;  Laterality: N/A;   Social History   Social  History  . Marital status: Single   Social History Main Topics  . Smoking status: Former Smoker    Quit date: 09/05/1986  . Smokeless tobacco: Never Used     Comment: college years  . Alcohol use No  . Drug use: No  . Sexual activity: Yes    Birth control/ protection: Surgical   Family History  Problem Relation Age of Onset  . Breast cancer Mother        mets  . Lung cancer Mother   . Cancer Mother        Breast  . Hypertension Mother   . Hypertension Maternal Aunt   . Liver cancer Father        probable  . Cancer Father        Colon  . Hypertension Father   . Ulcerative colitis Sister   . Clotting disorder Paternal Grandmother   . Clotting disorder Paternal Uncle      Current Outpatient Prescriptions:  .  acetaminophen (TYLENOL) 500 MG tablet, Take 250 mg by mouth at bedtime as needed for moderate pain or headache., Disp: , Rfl:  .  Cholecalciferol (VITAMIN D PO), Take 1 capsule by mouth daily., Disp: , Rfl:  .  Cyanocobalamin (VITAMIN B-12 PO), Take 1 tablet by mouth 3 (three) times a week., Disp: , Rfl:  .  diphenhydrAMINE (BENADRYL) 25 MG tablet, Take 25 mg by mouth daily as needed for allergies., Disp: ,  Rfl:  .  lisinopril (PRINIVIL,ZESTRIL) 40 MG tablet, Take 1 tablet (40 mg total) by mouth daily., Disp: 90 tablet, Rfl: 1 .  Multiple Vitamin (MULTIVITAMIN WITH MINERALS) TABS tablet, Take 1 tablet by mouth daily., Disp: , Rfl:  .  naphazoline (NAPHCON) 0.1 % ophthalmic solution, Place 2 drops into both eyes 3 (three) times daily as needed for eye irritation., Disp: , Rfl:  .  Omega-3 Fatty Acids (FISH OIL PO), Take 1 capsule by mouth 3 (three) times a week., Disp: , Rfl:  .  amLODipine (NORVASC) 5 MG tablet, Take 1 tablet (5 mg total) by mouth daily., Disp: 30 tablet, Rfl: 2 .  venlafaxine XR (EFFEXOR-XR) 75 MG 24 hr capsule, Take 1 capsule (75 mg total) by mouth daily with breakfast., Disp: 90 capsule, Rfl: 1  No LMP recorded. Patient has had a  hysterectomy.  ROS Cardiovascular: Denies chest pain  Respiratory: Denies dyspnea   OBJECTIVE: BP (!) 160/98 (BP Location: Left Arm, Patient Position: Sitting, Cuff Size: Normal)   Pulse 71   Temp 98.1 F (36.7 C) (Oral)   Ht 5' 0.75" (1.543 m)   Wt 133 lb 9.6 oz (60.6 kg)   SpO2 97%   BMI 25.45 kg/m   Constitutional: -  VS reviewed -  Well developed, well nourished, appears stated age -  No apparent distress  Psychiatric: -  Oriented to person, place, and time -  Memory intact -  Affect and mood normal -  Fluent conversation, good eye contact -  Judgment and insight age appropriate  Eye: -  Conjunctivae clear, no discharge -  Pupils symmetric, round, reactive to light  ENMT: -  Ears are patent b/l without erythema or discharge. TM's are shiny and clear b/l without evidence of effusion or infection. -  Oral mucosa without lesions, tongue and uvula midline    Tonsils not enlarged, no erythema, no exudate, trachea midline    Pharynx moist, no lesions, no erythema  Neck: -  No gross swelling, no palpable masses -  Thyroid midline, not enlarged, mobile, no palpable masses  Cardiovascular: -  RRR, 2/6 SEM heard loudest at aortic listening post -  +b/l bruits, volume did not significant change further distal from heart -  No LE edema  Respiratory: -  Normal respiratory effort, no accessory muscle use, no retraction -  Breath sounds equal, no wheezes, no ronchi, no crackles  Musculoskeletal: -  No clubbing, no cyanosis -  Gait normal  Skin: -  No significant lesion on inspection -  Warm and dry to palpation   ASSESSMENT/PLAN: Essential hypertension - Plan: lisinopril (PRINIVIL,ZESTRIL) 40 MG tablet, amLODipine (NORVASC) 5 MG tablet  DEPRESSION/ANXIETY - Plan: venlafaxine XR (EFFEXOR-XR) 75 MG 24 hr capsule  Bilateral carotid bruits - Plan: US Carotid Duplex Bilateral  Restart lisinopril. Stop Premarin, propranolol. Start amlodipine.  [place] for carotids. Could be murmur but  given that volume did not change after distancing from heart, will r/o. Restart Effexor.  She was on estrogen because her uterus was removed. She is not having issues with menopausal symptoms. Will D/C and see how she does.  Patient should return in 1 mo. The patient voiced understanding and agreement to the plan.   [place], DO 03/30/17  10:34 AM

## 2017-05-22 ENCOUNTER — Other Ambulatory Visit: Payer: Self-pay | Admitting: Obstetrics and Gynecology

## 2017-05-22 DIAGNOSIS — Z1231 Encounter for screening mammogram for malignant neoplasm of breast: Secondary | ICD-10-CM

## 2017-05-24 ENCOUNTER — Ambulatory Visit: Payer: BLUE CROSS/BLUE SHIELD | Admitting: Family Medicine

## 2017-05-24 ENCOUNTER — Telehealth: Payer: Self-pay | Admitting: Family Medicine

## 2017-05-24 NOTE — Telephone Encounter (Signed)
Pt called in at 8:14a to cancel her apt for today at 2:00p.  Pt said that she will call back to reschedule  Should pt be charged

## 2017-05-24 NOTE — Telephone Encounter (Signed)
No charge if she reschedules.

## 2017-06-08 ENCOUNTER — Ambulatory Visit (HOSPITAL_COMMUNITY)
Admission: RE | Admit: 2017-06-08 | Discharge: 2017-06-08 | Disposition: A | Discharge status: Home / Self Care | Payer: Self-pay | Source: Ambulatory Visit | Attending: Obstetrics and Gynecology | Admitting: Obstetrics and Gynecology

## 2017-06-08 ENCOUNTER — Ambulatory Visit
Admission: RE | Admit: 2017-06-08 | Discharge: 2017-06-08 | Disposition: A | Discharge status: Home / Self Care | Payer: No Typology Code available for payment source | Source: Ambulatory Visit | Attending: Obstetrics and Gynecology | Admitting: Obstetrics and Gynecology

## 2017-06-08 ENCOUNTER — Encounter (HOSPITAL_COMMUNITY): Payer: Self-pay

## 2017-06-08 VITALS — BP 140/84 | Temp 98.5°F | Ht 62.0 in | Wt 133.4 lb

## 2017-06-08 DIAGNOSIS — Z1231 Encounter for screening mammogram for malignant neoplasm of breast: Secondary | ICD-10-CM

## 2017-06-08 DIAGNOSIS — Z1239 Encounter for other screening for malignant neoplasm of breast: Secondary | ICD-10-CM

## 2017-06-08 NOTE — Progress Notes (Signed)
No complaints today.   Pap Smear: Pap smear not completed today. Last Pap smear was in 2016 by Dr. Bettey Mare and normal per patient. Per patient has no history of an abnormal Pap smear. Patient has a history of a hysterectomy in 1998 due to fibroids. Patient doesn't need any further Pap smears due to her history of a hysterectomy for benign reasons per BCCCP and ACOG guidelines. No Pap smear results are in EPIC.  Physical exam: Breasts Breasts symmetrical. No skin abnormalities bilateral breasts. No nipple retraction bilateral breasts. No nipple discharge bilateral breasts. No lymphadenopathy. No lumps palpated bilateral breasts. No complaints of pain or tenderness on exam. Referred patient to the [place] [facility] for a screening mammogram. Appointment scheduled for Thursday, June 08, 2017 at 1410.        Pelvic/Bimanual No Pap smear completed today since patient has a history of a hysterectomy for benign reasons. Pap smear not indicated per BCCCP guidelines.   Smoking History: Patient has never smoked.  Patient Navigation: Patient education provided. Access to services provided for patient through [facility] program.   Colorectal Cancer Screening: Per patient had a colonoscopy completed in July 2018. No complaints today.

## 2017-06-08 NOTE — Patient Instructions (Signed)
Explained breast self awareness with [name]. Patient did not need a Pap smear today due to her history of a hysterectomy for benign reasons. Let patient know that she doesn't need any further Pap smears due to her history of a hysterectomy for benign reasons. Referred patient to the [place] for a screening mammogram. Appointment scheduled for Thursday, June 08, 2017 at 1410. Let [facility] know the [facility] will follow up with her within the next couple weeks with results of mammogram by letter or phone. [name] verbalized understanding.  Yaffa Seckman, Arvil [place], RN 3:37 PM

## 2017-06-09 ENCOUNTER — Encounter (HOSPITAL_COMMUNITY): Payer: Self-pay | Admitting: *Deleted

## 2017-07-12 ENCOUNTER — Ambulatory Visit (INDEPENDENT_AMBULATORY_CARE_PROVIDER_SITE_OTHER): Payer: Self-pay

## 2017-07-12 DIAGNOSIS — Z23 Encounter for immunization: Secondary | ICD-10-CM

## 2017-09-23 ENCOUNTER — Other Ambulatory Visit: Payer: Self-pay | Admitting: Family Medicine

## 2017-09-23 DIAGNOSIS — I10 Essential (primary) hypertension: Secondary | ICD-10-CM

## 2018-01-15 ENCOUNTER — Telehealth: Payer: Self-pay | Admitting: Family Medicine

## 2018-01-15 ENCOUNTER — Other Ambulatory Visit: Payer: Self-pay | Admitting: Family Medicine

## 2018-01-15 DIAGNOSIS — F341 Dysthymic disorder: Secondary | ICD-10-CM

## 2018-01-15 DIAGNOSIS — I10 Essential (primary) hypertension: Secondary | ICD-10-CM

## 2018-01-15 NOTE — Telephone Encounter (Signed)
Copied from [place] [id number]. Topic: Quick Communication - Rx Refill/Question >> Jan 15, 2018  3:38 PM Selinda Flavin B, NT wrote: Medication: venlafaxine XR (EFFEXOR-XR) 75 MG 24 hr capsule Has the patient contacted their pharmacy? Yes.   (Agent: If no, request that the patient contact the pharmacy for the refill.) Preferred Pharmacy (with phone number or street name): [facility] # [place], [place] Agent: Please be advised that RX refills may take up to 3 business days. We ask that you follow-up with your pharmacy.

## 2018-01-16 MED ORDER — VENLAFAXINE HCL ER 75 MG PO CP24: 75.0000 mg | ORAL_CAPSULE | Freq: Every day | ORAL | 0 refills | Status: DC

## 2018-01-16 NOTE — Telephone Encounter (Signed)
Refill for venlafaxine XR 75 mg  24 hr cap LR 03/20/18  #90 caps and 1 refill  Dr. Eliseo Gum  03/30/17  [id number] [place]

## 2018-01-25 ENCOUNTER — Ambulatory Visit: Payer: Self-pay | Admitting: *Deleted

## 2018-01-25 NOTE — Telephone Encounter (Signed)
Pt stating that she has experienced numbness/tingling by the left ear that goes around the back of the head on the left side x 1 year. Pt states that the sensation is constant has has worsened recently. Pt denies any pain, headaches, loss or change of vision, extremity weakness or unsteadiness. Pt states she has an itching sensation to the area similar to a feeling of being stung by an insect. Pt scheduled for appt on tomorrow with Mackie Pai due to PCP not being available. Pt advised that if symptoms develop or she begins to feel worse before appt to seek treatment in the ED. Pt verbalized understanding.  Answer Assessment - Initial Assessment Questions 1. SYMPTOM: "What is the main symptom you are concerned about?" (e.g., weakness, numbness)     Numbness and tingling by the left ear to the back of head 2. ONSET: "When did this start?" (minutes, hours, days; while sleeping)    A year ago and the pt states that it has worsened recently 3. LAST NORMAL: "When was the last time you were normal (no symptoms)?"     A year ago 4. PATTERN "Does this come and go, or has it been constant since it started?"  "Is it present now?"     constant 5. CARDIAC SYMPTOMS: "Have you had any of the following symptoms: chest pain, difficulty breathing, palpitations?"     No 6. NEUROLOGIC SYMPTOMS: "Have you had any of the following symptoms: headache, dizziness, vision loss, double vision, changes in speech, unsteady on your feet?"     No 7. OTHER SYMPTOMS: "Do you have any other symptoms?"     No 8. PREGNANCY: "Is there any chance you are pregnant?" "When was your last menstrual period?"n/a  Protocols used: NEUROLOGIC DEFICIT-A-AH

## 2018-01-26 ENCOUNTER — Ambulatory Visit (INDEPENDENT_AMBULATORY_CARE_PROVIDER_SITE_OTHER): Payer: PRIVATE HEALTH INSURANCE | Admitting: Medical

## 2018-01-26 ENCOUNTER — Encounter

## 2018-01-26 ENCOUNTER — Telehealth: Payer: Self-pay | Admitting: Family Medicine

## 2018-01-26 ENCOUNTER — Encounter: Payer: Self-pay | Admitting: Medical

## 2018-01-26 VITALS — BP 135/80 | HR 69 | Temp 98.0°F | Resp 16 | Ht 60.0 in | Wt 135.2 lb

## 2018-01-26 DIAGNOSIS — R944 Abnormal results of kidney function studies: Secondary | ICD-10-CM

## 2018-01-26 DIAGNOSIS — M542 Cervicalgia: Secondary | ICD-10-CM

## 2018-01-26 DIAGNOSIS — S46812A Strain of other muscles, fascia and tendons at shoulder and upper arm level, left arm, initial encounter: Secondary | ICD-10-CM | POA: Diagnosis not present

## 2018-01-26 MED ORDER — PREDNISONE 10 MG PO TABS: ORAL_TABLET | ORAL | 0 refills | Status: DC

## 2018-01-26 MED ORDER — CYCLOBENZAPRINE HCL 10 MG PO TABS: 10.0000 mg | ORAL_TABLET | Freq: Every day | ORAL | 0 refills | Status: DC

## 2018-01-26 NOTE — Progress Notes (Signed)
Subjective:    Patient ID: Casey Frazier, female    DOB: March 21, 1956, 62 y.o.   MRN: [id number]  HPI  Pt in with one year of slight tingling to her left  scalp. About one year ago she states tingling in front of her left ear. Tingling in front of her left ear almost went away completley. Now mostly tingling behind her ear. Some itching intermittently. Pt get ha very rare but not associated with scalp tingling. If gets ha in temporal areas at times with stress. Also occasional sinus type headache. No ringing of her ear. No left ear pressure or pain.   Pt has moderate high level stress.    Review of Systems  Constitutional: Negative for chills, fatigue and fever.  Respiratory: Negative for cough, chest tightness, shortness of breath and wheezing.   Cardiovascular: Negative for chest pain and palpitations.  Gastrointestinal: Negative for abdominal pain.  Musculoskeletal: Negative for back pain.       Left side upper trapezius pain.  Skin: Negative for rash.       See hpi slight tingling to skin.  Neurological: Negative for dizziness, speech difficulty, weakness, numbness and headaches.  Hematological: Negative for adenopathy. Does not bruise/bleed easily.  Psychiatric/Behavioral: Negative for behavioral problems and confusion. The patient is not nervous/anxious.     Past Medical History:  Diagnosis Date  . Allergy   . Anxiety   . Colon polyps   . Depression   . GERD (gastroesophageal reflux disease)   . Hayfever   . Headache    migraines  . Heart murmur   . History of colon polyps   . Hypertension   . Migraines      Social History   Socioeconomic History  . Marital status: Single    Spouse name: Not on file  . Number of children: 0  . Years of education: Not on file  . Highest education level: Not on file  Occupational History  . Not on file  Social Needs  . Financial resource strain: Not on file  . Food insecurity:    Worry: Not on file    Inability: Not on  file  . Transportation needs:    Medical: Not on file    Non-medical: Not on file  Tobacco Use  . Smoking status: Former Smoker    Last attempt to quit: 09/05/1986    Years since quitting: 31.4  . Smokeless tobacco: Never Used  . Tobacco comment: college years  Substance and Sexual Activity  . Alcohol use: No  . Drug use: No  . Sexual activity: Not Currently    Birth control/protection: Surgical  Lifestyle  . Physical activity:    Days per week: Not on file    Minutes per session: Not on file  . Stress: Not on file  Relationships  . Social connections:    Talks on phone: Not on file    Gets together: Not on file    Attends religious service: Not on file    Active member of club or organization: Not on file    Attends meetings of clubs or organizations: Not on file    Relationship status: Not on file  . Intimate partner violence:    Fear of current or ex partner: Not on file    Emotionally abused: Not on file    Physically abused: Not on file    Forced sexual activity: Not on file  Other Topics Concern  . Not on file  Social  History Narrative  . Not on file    Past Surgical History:  Procedure Laterality Date  . ABDOMINAL HYSTERECTOMY  1999   complete  . BREAST BIOPSY Left    benign  . COLONOSCOPY WITH PROPOFOL N/A 03/24/2017   Procedure: COLONOSCOPY WITH PROPOFOL;  Surgeon: Doran Stabler, MD;  Location: [facility];  Service: Gastroenterology;  Laterality: N/A;  . colonscopy     with polyps removed  . ESOPHAGOGASTRODUODENOSCOPY (EGD) WITH PROPOFOL N/A 03/24/2017   Procedure: ESOPHAGOGASTRODUODENOSCOPY (EGD) WITH PROPOFOL;  Surgeon: Doran Stabler, MD;  Location: [facility];  Service: Gastroenterology;  Laterality: N/A;    Family History  Problem Relation Age of Onset  . Breast cancer Mother        mets  . Lung cancer Mother   . Cancer Mother        Breast  . Hypertension Mother   . Hypertension Maternal Aunt   . Liver cancer Father         probable  . Cancer Father        Colon  . Hypertension Father   . Ulcerative colitis Sister   . Clotting disorder Paternal Grandmother   . Clotting disorder Paternal Uncle     No Known Allergies  Current Outpatient Medications on File Prior to Visit  Medication Sig Dispense Refill  . acetaminophen (TYLENOL) 500 MG tablet Take 250 mg by mouth at bedtime as needed for moderate pain or headache.    . diphenhydrAMINE (BENADRYL) 25 MG tablet Take 25 mg by mouth daily as needed for allergies.    Marland Kitchen estrogens, conjugated, (PREMARIN) 0.625 MG tablet Take 0.625 mg by mouth daily. Take daily for 21 days then do not take for 7 days.    Marland Kitchen lisinopril (PRINIVIL,ZESTRIL) 40 MG tablet TAKE ONE TABLET BY MOUTH ONE TIME DAILY  90 tablet 0  . venlafaxine XR (EFFEXOR-XR) 75 MG 24 hr capsule Take 1 capsule (75 mg total) by mouth daily with breakfast. 90 capsule 0  . amLODipine (NORVASC) 5 MG tablet Take 1 tablet (5 mg total) by mouth daily. (Patient not taking: Reported on 06/08/2017) 30 tablet 2  . Cholecalciferol (VITAMIN D PO) Take 1 capsule by mouth daily.    . Cyanocobalamin (VITAMIN B-12 PO) Take 1 tablet by mouth 3 (three) times a week.    . Multiple Vitamin (MULTIVITAMIN WITH MINERALS) TABS tablet Take 1 tablet by mouth daily.    . naphazoline (NAPHCON) 0.1 % ophthalmic solution Place 2 drops into both eyes 3 (three) times daily as needed for eye irritation.    . Omega-3 Fatty Acids (FISH OIL PO) Take 1 capsule by mouth 3 (three) times a week.     No current facility-administered medications on file prior to visit.     BP 135/80   Pulse 69   Temp 98 F (36.7 C) (Oral)   Resp 16   Ht 5' (1.524 m)   Wt 135 lb 3.2 oz (61.3 kg)   SpO2 99%   BMI 26.40 kg/m       Objective:   Physical Exam  General Mental Status- Alert. General Appearance- Not in acute distress.   Skin General: Color- Normal Color. Moisture- Normal Moisture. No abnormality on inspection of the scalp.   Neck Left side  trapezius tendernes to palpation at insetion site. No neck stiffness.   Chest and Lung Exam Auscultation: Breath Sounds:-Normal.  Cardiovascular Auscultation:Rythm- Regular. Murmurs & Other Heart Sounds:Auscultation of the heart reveals- No Murmurs.  Abdomen Inspection:-Inspeection Normal. Palpation/Percussion:Note:No mass. Palpation and Percussion of the abdomen reveal- Non Tender, Non Distended + BS, no rebound or guarding.  Neurologic Cranial Nerve exam:- CN III-XII intact(No nystagmus), symmetric smile. Drift Test:- No drift. Romberg Exam:- Negative.  Heal to Toe Gait exam:-Normal. Finger to Nose:- Normal/Intact Strength:- 5/5 equal and symmetric strength both upper and lower extremities. Left temporal area non-tender.   HEENT Head- Normal. Ear Auditory Canal - Left- Normal. Right - Normal.Tympanic Membrane- Left- Normal. Right- Normal. Eye Sclera/Conjunctiva- Left- Normal. Right- Normal. Nose & Sinuses Nasal Mucosa- Left- Not  Boggy and Congested. Right-  not Boggy and  Congested.Bilateral no  maxillary and no  frontal sinus pressure. Mouth & Throat Lips: Upper Lip- Normal: no dryness, cracking, pallor, cyanosis, or vesicular eruption. Lower Lip-Normal: no dryness, cracking, pallor, cyanosis or vesicular eruption. Buccal Mucosa- Bilateral- No Aphthous ulcers. Oropharynx- No Discharge or Erythema. Tonsils: Characteristics- Bilateral- No Erythema or Congestion. Size/Enlargement- Bilateral- No enlargement. Discharge- bilateral-None.          Assessment & Plan:  For your history of left side trapezius/occipital region pain, I do think this represents probable tight trapezius muscle.  I am prescribing Flexeril muscle relaxant to use at night and 4-day taper dose of prednisone.  Consider use of ibuprofen but you report this causes swelling and therefore want to avoid other NSAIDs.  Please get sed rate and CMP today.  Want to check your kidney function due to slight  decreased GFR in the past.  You expressed concern for dangerous diagnosis.  You have a completely normal neurologic exam and presently no worrisome neurologic signs or symptoms presently.  However we need to watch you closely and see how you respond to the above treatment.  If your symptoms change or worsen as discussed then be seen in the emergency department.  I will reassess her condition on follow-up and see if CT imaging is indicated.  Follow-up in 7 to 10 days or as needed.  Mackie Pai, PA-C

## 2018-01-26 NOTE — Telephone Encounter (Signed)
Copied from [place] [id number]. Topic: Quick Communication - See Telephone Encounter >> Jan 26, 2018  5:00 PM Arletha Grippe wrote: CRM for notification. See Telephone encounter for: 01/26/18.predniSONE (DELTASONE) 10 MG tablet  [facility] called - they need to verify the qty of the medication -  [facility] - [phone]

## 2018-01-26 NOTE — Patient Instructions (Signed)
For your history of left side trapezius/occipital region pain, I do think this represents probable tight trapezius muscle.  I am prescribing Flexeril muscle relaxant to use at night and 4-day taper dose of prednisone.  Consider use of ibuprofen but you report this causes swelling and therefore want to avoid other NSAIDs.  Please get sed rate and CMP today.  Want to check your kidney function due to slight decreased GFR in the past.  You expressed concern for dangerous diagnosis.  You have a completely normal neurologic exam and presently no worrisome neurologic signs or symptoms presently.  However we need to watch you closely and see how you respond to the above treatment.  If your symptoms change or worsen as discussed then be seen in the emergency department.  I will reassess her condition on follow-up and see if CT imaging is indicated.  Follow-up in 7 to 10 days or as needed.

## 2018-01-27 LAB — COMPREHENSIVE METABOLIC PANEL
AG RATIO: 1.5 (calc) (ref 1.0–2.5)
ALBUMIN MSPROF: 4 g/dL (ref 3.6–5.1)
ALT: 13 U/L (ref 6–29)
AST: 15 U/L (ref 10–35)
Alkaline phosphatase (APISO): 112 U/L (ref 33–130)
BUN: 15 mg/dL (ref 7–25)
CHLORIDE: 103 mmol/L (ref 98–110)
CO2: 32 mmol/L (ref 20–32)
Calcium: 10 mg/dL (ref 8.6–10.4)
Creat: 0.92 mg/dL (ref 0.50–0.99)
GLOBULIN: 2.6 g/dL (ref 1.9–3.7)
GLUCOSE: 90 mg/dL (ref 65–99)
Potassium: 4.2 mmol/L (ref 3.5–5.3)
SODIUM: 140 mmol/L (ref 135–146)
TOTAL PROTEIN: 6.6 g/dL (ref 6.1–8.1)
Total Bilirubin: 0.3 mg/dL (ref 0.2–1.2)

## 2018-01-27 LAB — SEDIMENTATION RATE: Sed Rate: 2 mm/h (ref 0–30)

## 2018-01-30 NOTE — Telephone Encounter (Signed)
The prescription in epic reads prednisone 10 mg #10 tab Sig 4 tab po day 1, 3 tab po day 2, 2 tab po day 3, 1 tab po day 4.   What is it they wanted to verify 4 days ago?  Was [employer].

## 2018-02-05 ENCOUNTER — Encounter: Payer: Self-pay | Admitting: Medical

## 2018-02-05 ENCOUNTER — Ambulatory Visit (INDEPENDENT_AMBULATORY_CARE_PROVIDER_SITE_OTHER): Payer: PRIVATE HEALTH INSURANCE | Admitting: Medical

## 2018-02-05 VITALS — BP 135/86 | HR 94 | Temp 98.1°F | Resp 16 | Ht 60.0 in | Wt 137.4 lb

## 2018-02-05 DIAGNOSIS — R591 Generalized enlarged lymph nodes: Secondary | ICD-10-CM

## 2018-02-05 DIAGNOSIS — S0006XA Insect bite (nonvenomous) of scalp, initial encounter: Secondary | ICD-10-CM | POA: Diagnosis not present

## 2018-02-05 DIAGNOSIS — W57XXXA Bitten or stung by nonvenomous insect and other nonvenomous arthropods, initial encounter: Secondary | ICD-10-CM

## 2018-02-05 MED ORDER — DOXYCYCLINE HYCLATE 100 MG PO TABS: 100.0000 mg | ORAL_TABLET | Freq: Two times a day (BID) | ORAL | 0 refills | Status: DC

## 2018-02-05 NOTE — Patient Instructions (Addendum)
Your report no improvement with the prednisone and muscle relaxant.  So this does indicate that your trapezius muscle not likely strained as source of pain.  You did note today history of probable tick bite in that area about 1 year ago.  So I do think it is a good idea to go ahead and get a tick bite studies to include [facility] spotted fever and Lyme antibodies.  Also will get a CBC.  The area does feel slightly inflamed with possible palpation of the lymph node.  Differential diagnosis could include folliculitis.  We will go ahead and prescribe doxycycline which is an antibiotic they can treat folliculitis well and treat tickborne diseases in the event we get a positive on your tick bite studies.  Rx advisement given for doxycycline.  Follow-up in 7 to 10 days or as needed.

## 2018-02-05 NOTE — Progress Notes (Signed)
Subjective:    Patient ID: Casey Frazier, female    DOB: Apr 22, 1956, 62 y.o.   MRN: [id number]  HPI  Pt in for some symptoms that are similar as before.  Now pt does remember remote history of finding finding tick on her left side occipital region about one year ago and express concern for tick born disease.  Pt also has dog who had some tick disease but other than lyme or RMSF.  She can't remember if felt body aches, fevers or chills at the time tick was found. Again that was a year ago.  She still feels slight tenderness in the scalp region.  Mild numbness, tingling, sharp tingling pain for about one year. She sharp pain is very transient. Some fullness to that region in occipital area.    Review of Systems  Constitutional: Negative for chills, fatigue and fever.  Musculoskeletal: Positive for neck pain. Negative for arthralgias, back pain and gait problem.       Left trapezius pain and occipital  region pain. Discomfort.  Neurological: Negative for dizziness, seizures, syncope, weakness, light-headedness, numbness and headaches.  Hematological: Negative for adenopathy. Does not bruise/bleed easily.   Past Medical History:  Diagnosis Date  . Allergy   . Anxiety   . Colon polyps   . Depression   . GERD (gastroesophageal reflux disease)   . Hayfever   . Headache    migraines  . Heart murmur   . History of colon polyps   . Hypertension   . Migraines      Social History   Socioeconomic History  . Marital status: Single    Spouse name: Not on file  . Number of children: 0  . Years of education: Not on file  . Highest education level: Not on file  Occupational History  . Not on file  Social Needs  . Financial resource strain: Not on file  . Food insecurity:    Worry: Not on file    Inability: Not on file  . Transportation needs:    Medical: Not on file    Non-medical: Not on file  Tobacco Use  . Smoking status: Former Smoker    Last attempt to quit:  09/05/1986    Years since quitting: 31.4  . Smokeless tobacco: Never Used  . Tobacco comment: college years  Substance and Sexual Activity  . Alcohol use: No  . Drug use: No  . Sexual activity: Not Currently    Birth control/protection: Surgical  Lifestyle  . Physical activity:    Days per week: Not on file    Minutes per session: Not on file  . Stress: Not on file  Relationships  . Social connections:    Talks on phone: Not on file    Gets together: Not on file    Attends religious service: Not on file    Active member of club or organization: Not on file    Attends meetings of clubs or organizations: Not on file    Relationship status: Not on file  . Intimate partner violence:    Fear of current or ex partner: Not on file    Emotionally abused: Not on file    Physically abused: Not on file    Forced sexual activity: Not on file  Other Topics Concern  . Not on file  Social History Narrative  . Not on file    Past Surgical History:  Procedure Laterality Date  . ABDOMINAL HYSTERECTOMY  1999  complete  . BREAST BIOPSY Left    benign  . COLONOSCOPY WITH PROPOFOL N/A 03/24/2017   Procedure: COLONOSCOPY WITH PROPOFOL;  Surgeon: Doran Stabler, MD;  Location: [facility];  Service: Gastroenterology;  Laterality: N/A;  . colonscopy     with polyps removed  . ESOPHAGOGASTRODUODENOSCOPY (EGD) WITH PROPOFOL N/A 03/24/2017   Procedure: ESOPHAGOGASTRODUODENOSCOPY (EGD) WITH PROPOFOL;  Surgeon: Doran Stabler, MD;  Location: [facility];  Service: Gastroenterology;  Laterality: N/A;    Family History  Problem Relation Age of Onset  . Breast cancer Mother        mets  . Lung cancer Mother   . Cancer Mother        Breast  . Hypertension Mother   . Hypertension Maternal Aunt   . Liver cancer Father        probable  . Cancer Father        Colon  . Hypertension Father   . Ulcerative colitis Sister   . Clotting disorder Paternal Grandmother   . Clotting disorder  Paternal Uncle     No Known Allergies  Current Outpatient Medications on File Prior to Visit  Medication Sig Dispense Refill  . acetaminophen (TYLENOL) 500 MG tablet Take 250 mg by mouth at bedtime as needed for moderate pain or headache.    . cyclobenzaprine (FLEXERIL) 10 MG tablet Take 1 tablet (10 mg total) by mouth at bedtime. 7 tablet 0  . diphenhydrAMINE (BENADRYL) 25 MG tablet Take 25 mg by mouth daily as needed for allergies.    Marland Kitchen lisinopril (PRINIVIL,ZESTRIL) 40 MG tablet TAKE ONE TABLET BY MOUTH ONE TIME DAILY  90 tablet 0  . Multiple Vitamin (MULTIVITAMIN WITH MINERALS) TABS tablet Take 1 tablet by mouth daily.    . naphazoline (NAPHCON) 0.1 % ophthalmic solution Place 2 drops into both eyes 3 (three) times daily as needed for eye irritation.    . predniSONE (DELTASONE) 10 MG tablet 4 tab po day 1, 3 tab po day 2, 2 tab po day 3, 1 tab po day 4. 10 tablet 0  . venlafaxine XR (EFFEXOR-XR) 75 MG 24 hr capsule Take 1 capsule (75 mg total) by mouth daily with breakfast. 90 capsule 0   No current facility-administered medications on file prior to visit.     BP 135/86   Pulse 94   Temp 98.1 F (36.7 C) (Oral)   Resp 16   Ht 5' (1.524 m)   Wt 137 lb 6.4 oz (62.3 kg)   SpO2 99%   BMI 26.83 kg/m      Objective:   Physical Exam   General Mental Status- Alert. General Appearance- Not in acute distress.   Skin General: Color- Normal Color. Moisture- Normal Moisture. No abnormality on inspection of the scalp.  Neck Left side-slightly inflamed compared to the right side on palpation.  May be minimal small lymph node palpated today.  Range of motion.  Not obviously painful. No rash on inspection of scalp.   Chest and Lung Exam Auscultation: Breath Sounds:-Normal.  Cardiovascular Auscultation:Rythm- Regular. Murmurs & Other Heart Sounds:Auscultation of the heart reveals- No Murmurs.  Abdomen Inspection:-Inspeection Normal. Palpation/Percussion:Note:No mass.  Palpation and Percussion of the abdomen reveal- Non Tender, Non Distended + BS, no rebound or guarding.  Neurologic Cranial Nerve exam:- CN III-XII intact(No nystagmus), symmetric smile. Drift Test:- No drift. Romberg Exam:- Negative.  Heal to Toe Gait exam:-Normal. Finger to Nose:- Normal/Intact Strength:- 5/5 equal and symmetric strength both upper and lower  extremities.      Assessment & Plan:  Your report no improvement with the prednisone and muscle relaxant.  So this does indicate that your trapezius muscle not likely strained as source of pain.  You did note today history of probable tick bite in that area about 1 year ago.  So I do think it is a good idea to go ahead and get a tick bite studies to include [facility] spotted fever and Lyme antibodies.  Also will get a CBC.  The area does feel slightly inflamed with possible palpation of the lymph node.  Differential diagnosis could include folliculitis.  We will go ahead and prescribe doxycycline which is an antibiotic they can treat folliculitis well and treat tickborne diseases in the event we get a positive on your tick bite studies.  Rx advisement given for doxycycline.  Follow-up in 7 to 10 days or as needed.  Did counsel patient today regarding following her labs and her response to treatment.  So far hard to pinpoint exact diagnosis and will send note to her PCP.  Also will send last note to her PCP as well. Mackie Pai, PA-C

## 2018-02-06 LAB — CBC WITH DIFFERENTIAL/PLATELET
BASOS PCT: 0.7 % (ref 0.0–3.0)
Basophils Absolute: 0.1 10*3/uL (ref 0.0–0.1)
EOS ABS: 0.2 10*3/uL (ref 0.0–0.7)
EOS PCT: 2.6 % (ref 0.0–5.0)
HCT: 41.3 % (ref 36.0–46.0)
Hemoglobin: 13.7 g/dL (ref 12.0–15.0)
Lymphocytes Relative: 53.7 % — ABNORMAL HIGH (ref 12.0–46.0)
Lymphs Abs: 4.1 10*3/uL — ABNORMAL HIGH (ref 0.7–4.0)
MCHC: 33.3 g/dL (ref 30.0–36.0)
MCV: 90.1 fl (ref 78.0–100.0)
MONO ABS: 0.7 10*3/uL (ref 0.1–1.0)
Monocytes Relative: 9 % (ref 3.0–12.0)
NEUTROS ABS: 2.6 10*3/uL (ref 1.4–7.7)
NEUTROS PCT: 34 % — AB (ref 43.0–77.0)
PLATELETS: 320 10*3/uL (ref 150.0–400.0)
RBC: 4.58 Mil/uL (ref 3.87–5.11)
RDW: 12.5 % (ref 11.5–15.5)
WBC: 7.7 10*3/uL (ref 4.0–10.5)

## 2018-02-06 LAB — REFLEX RMSF IGG TITER

## 2018-02-06 LAB — ROCKY MTN SPOTTED FVR ABS PNL(IGG+IGM)
RMSF IGG: DETECTED — AB
RMSF IgM: NOT DETECTED

## 2018-02-06 LAB — B. BURGDORFI ANTIBODIES: B burgdorferi Ab IgG+IgM: <0.9 index

## 2018-02-15 ENCOUNTER — Telehealth: Payer: Self-pay

## 2018-02-15 NOTE — Telephone Encounter (Signed)
There was no message routed to me to review?? Can you look into this before the weekend. [place] is Friday?

## 2018-02-16 ENCOUNTER — Telehealth: Payer: Self-pay | Admitting: Medical

## 2018-02-16 ENCOUNTER — Telehealth: Payer: Self-pay

## 2018-02-16 DIAGNOSIS — W57XXXA Bitten or stung by nonvenomous insect and other nonvenomous arthropods, initial encounter: Secondary | ICD-10-CM

## 2018-02-16 NOTE — Telephone Encounter (Signed)
Left pt a message to call back. 

## 2018-02-16 NOTE — Telephone Encounter (Signed)
Copied from [place]. Topic: General - Other >> Feb 15, 2018  3:43 PM Keene Breath wrote: Reason for CRM: Patient called to inform the doctor that she has completed the antibiotics he prescribed for her tick bite.  She would like to know the next course of action she should take now.  CB# [phone].

## 2018-02-16 NOTE — Telephone Encounter (Signed)
See referral to infectious disease. Can be internal or external. Just want her to get in quicker.

## 2018-02-16 NOTE — Telephone Encounter (Signed)
I put in referral to specialist/infetious disease to see if they think if any further treatment needed

## 2018-02-16 NOTE — Telephone Encounter (Signed)
Pt.notified

## 2018-03-05 ENCOUNTER — Ambulatory Visit (INDEPENDENT_AMBULATORY_CARE_PROVIDER_SITE_OTHER): Payer: PRIVATE HEALTH INSURANCE | Admitting: Internal Medicine

## 2018-03-05 ENCOUNTER — Encounter: Payer: Self-pay | Admitting: Internal Medicine

## 2018-03-05 DIAGNOSIS — R200 Anesthesia of skin: Secondary | ICD-10-CM | POA: Diagnosis not present

## 2018-03-05 NOTE — Progress Notes (Signed)
Short for Infectious Disease      Reason for Consult: history of a tick exposure    Referring Physician: E Saguier PA-C    Patient ID: Casey Frazier, female    DOB: [birth year], 62 y.o.   MRN: [id number]  HPI:   Here for evaluation due to a tick exposure about 2-3 years ago.  Since that time she has had some intermittent numbness of her left occipital region.  It is not painful, not associated with neuropathy, no facial droop, no history of rash associated with it.  She noted a tick in that region and on her abdomen but was not attached.  Some possible subjective fever at that time.  None now.  No hearing loss, no ear pain or discharge.  Tested for RMSF and Lyme though no symptoms suggestive of either.  RMSF IgG positive. Also with some tingling on mainly her right wrist on the radial side and goes into her thumb region.  Was given doxycycline.  Previous record reviewed in Epic as above.    Past Medical History:  Diagnosis Date  . Allergy   . Anxiety   . Colon polyps   . Depression   . GERD (gastroesophageal reflux disease)   . Hayfever   . Headache    migraines  . Heart murmur   . History of colon polyps   . Hypertension   . Migraines     Prior to Admission medications   Medication Sig Start Date End Date Taking? Authorizing Provider  acetaminophen (TYLENOL) 500 MG tablet Take 250 mg by mouth at bedtime as needed for moderate pain or headache.   Yes [provider]  diphenhydrAMINE (BENADRYL) 25 MG tablet Take 25 mg by mouth daily as needed for allergies.   Yes [provider]  lisinopril (PRINIVIL,ZESTRIL) 40 MG tablet TAKE ONE TABLET BY MOUTH ONE TIME DAILY  01/15/18  Yes Shelda Pal, DO  Multiple Vitamin (MULTIVITAMIN WITH MINERALS) TABS tablet Take 1 tablet by mouth daily.   Yes [provider]  naphazoline (NAPHCON) 0.1 % ophthalmic solution Place 2 drops into both eyes 3 (three) times daily as needed for eye irritation.    Yes [provider]  venlafaxine XR (EFFEXOR-XR) 75 MG 24 hr capsule Take 1 capsule (75 mg total) by mouth daily with breakfast. 01/16/18  Yes Wendling, Crosby Oyster, DO  doxycycline (VIBRA-TABS) 100 MG tablet Take 1 tablet (100 mg total) by mouth 2 (two) times daily. Caps or generic Patient not taking: Reported on 03/05/2018 02/05/18   Saguier, Percell Miller, PA-C    No Known Allergies  Social History   Tobacco Use  . Smoking status: Former Smoker    Last attempt to quit: 09/05/1986    Years since quitting: 31.5  . Smokeless tobacco: Never Used  . Tobacco comment: college years  Substance Use Topics  . Alcohol use: No  . Drug use: No    Family History  Problem Relation Age of Onset  . Breast cancer Mother        mets  . Lung cancer Mother   . Cancer Mother        Breast  . Hypertension Mother   . Hypertension Maternal Aunt   . Liver cancer Father        probable  . Cancer Father        Colon  . Hypertension Father   . Ulcerative colitis Sister   . Clotting disorder Paternal Grandmother   .  Clotting disorder Paternal Uncle     Review of Systems  Constitutional: negative for fevers, chills, malaise, anorexia and weight loss Gastrointestinal: negative for nausea and diarrhea Integument/breast: negative for rash Musculoskeletal: negative for myalgias and arthralgias All other systems reviewed and are negative    Constitutional: in no apparent distress and alert  Vitals:   03/05/18 1506  BP: (!) 145/95  Pulse: 65  Temp: 97.8 F (36.6 C)   EYES: anicteric ENMT:left ear with normal TM, no erythema, no canal redness Cardiovascular: Cor RRR Respiratory: CTA B; normal respiratory effort Musculoskeletal: no wrist swelling Skin: negatives: no rash Hematologic: no cervical lad; no occipital lad Neuro: non-focal  Labs: Lab Results  Component Value Date   WBC 7.7 02/05/2018   HGB 13.7 02/05/2018   HCT 41.3 02/05/2018   MCV 90.1 02/05/2018   PLT 320.0 02/05/2018     Lab Results  Component Value Date   CREATININE 0.92 01/26/2018   BUN 15 01/26/2018   NA 140 01/26/2018   K 4.2 01/26/2018   CL 103 01/26/2018   CO2 32 01/26/2018    Lab Results  Component Value Date   ALT 13 01/26/2018   AST 15 01/26/2018   ALKPHOS 70 03/20/2017   BILITOT 0.3 01/26/2018     Assessment: occasional numbness of left occipital region.  No concerns for tick-related infection with no history of Lyme disease symptoms and not in an endemic area so no testing indicated, though was done and negative. No symptoms of RMSF so no testing indicated, though IgG positive so may have had a previous exposure. Possible carpal tunnel.    Plan: 1) stop doxycycline 2) wrist brace for carpal tunnel 3) rtc prn

## 2018-04-07 ENCOUNTER — Other Ambulatory Visit: Payer: Self-pay | Admitting: Family Medicine

## 2018-04-07 DIAGNOSIS — F341 Dysthymic disorder: Secondary | ICD-10-CM

## 2018-04-09 NOTE — Telephone Encounter (Signed)
Sent 2 week supply of venlafaxine to pharmacy. Pt is past due for follow up with Dr Nani Ravens and needs office visit for greater quantities / refills. Please call pt to schedule appt soon. Thanks!

## 2018-04-09 NOTE — Telephone Encounter (Signed)
Called pt twice and cell phone states can not contact me with pt, it was not available at that time to try again.

## 2018-04-27 ENCOUNTER — Ambulatory Visit (INDEPENDENT_AMBULATORY_CARE_PROVIDER_SITE_OTHER): Payer: PRIVATE HEALTH INSURANCE | Admitting: Family Medicine

## 2018-04-27 ENCOUNTER — Telehealth (HOSPITAL_COMMUNITY): Payer: Self-pay

## 2018-04-27 ENCOUNTER — Encounter: Payer: Self-pay | Admitting: Family Medicine

## 2018-04-27 VITALS — BP 118/70 | HR 100 | Temp 98.5°F | Ht 62.0 in | Wt 140.1 lb

## 2018-04-27 DIAGNOSIS — Z23 Encounter for immunization: Secondary | ICD-10-CM

## 2018-04-27 DIAGNOSIS — F341 Dysthymic disorder: Secondary | ICD-10-CM

## 2018-04-27 DIAGNOSIS — Z1159 Encounter for screening for other viral diseases: Secondary | ICD-10-CM

## 2018-04-27 DIAGNOSIS — Z0000 Encounter for general adult medical examination without abnormal findings: Secondary | ICD-10-CM

## 2018-04-27 MED ORDER — VENLAFAXINE HCL ER 75 MG PO CP24: ORAL_CAPSULE | ORAL | 1 refills | Status: DC

## 2018-04-27 NOTE — Telephone Encounter (Signed)
Tried to call left a message to call [facility]

## 2018-04-27 NOTE — Patient Instructions (Addendum)
Give [place] 2-3 business days to get the results of your labs back.  Aim to do some physical exertion for 150 minutes per week. This is typically divided into 5 days per week, 30 minutes per day. The activity should be enough to get your heart rate up. Anything is better than nothing if you have time constraints.  Keep the diet clean.  Think about the Shingles vaccine. It can make you ill for 48 hrs afterwards so plan accordingly.   Think about the flu shot for mid October.  Let us know if you need anything.

## 2018-04-27 NOTE — Progress Notes (Signed)
Pre visit review using our clinic review tool, if applicable. No additional management support is needed unless otherwise documented below in the visit note. 

## 2018-04-27 NOTE — Progress Notes (Signed)
Chief Complaint  Patient presents with  . Annual Exam     Well Woman Casey Frazier is here for a complete physical.   Her last physical was >1 year ago.  Current diet: in general, an "OK" diet. Current exercise: walking. Weight is stable and she denies daytime fatigue. No LMP recorded. Patient has had a hysterectomy.  Seatbelt? Yes  Health Maintenance Pap/HPV- N/A Mammogram- Yes Tetanus- No Hep C screening- No HIV screening- Yes  Past Medical History:  Diagnosis Date  . Allergy   . Anxiety   . Colon polyps   . Depression   . GERD (gastroesophageal reflux disease)   . Hayfever   . Headache    migraines  . Heart murmur   . History of colon polyps   . Hypertension   . Migraines      Past Surgical History:  Procedure Laterality Date  . ABDOMINAL HYSTERECTOMY  1999   complete  . BREAST BIOPSY Left    benign  . COLONOSCOPY WITH PROPOFOL N/A 03/24/2017   Procedure: COLONOSCOPY WITH PROPOFOL;  Surgeon: Doran Stabler, MD;  Location: [facility];  Service: Gastroenterology;  Laterality: N/A;  . colonscopy     with polyps removed  . ESOPHAGOGASTRODUODENOSCOPY (EGD) WITH PROPOFOL N/A 03/24/2017   Procedure: ESOPHAGOGASTRODUODENOSCOPY (EGD) WITH PROPOFOL;  Surgeon: Doran Stabler, MD;  Location: [facility];  Service: Gastroenterology;  Laterality: N/A;    Medications  Current Outpatient Medications on File Prior to Visit  Medication Sig Dispense Refill  . acetaminophen (TYLENOL) 500 MG tablet Take 250 mg by mouth at bedtime as needed for moderate pain or headache.    . diphenhydrAMINE (BENADRYL) 25 MG tablet Take 25 mg by mouth daily as needed for allergies.    Marland Kitchen lisinopril (PRINIVIL,ZESTRIL) 40 MG tablet TAKE ONE TABLET BY MOUTH ONE TIME DAILY  90 tablet 0  . Multiple Vitamin (MULTIVITAMIN WITH MINERALS) TABS tablet Take 1 tablet by mouth daily.    Marland Kitchen venlafaxine XR (EFFEXOR-XR) 75 MG 24 hr capsule TAKE ONE CAPSULE BY MOUTH ONE TIME DAILY WITH BREAKFAST  14  capsule 0  . naphazoline (NAPHCON) 0.1 % ophthalmic solution Place 2 drops into both eyes 3 (three) times daily as needed for eye irritation.     Allergies No Known Allergies  Review of Systems: Constitutional:  no unexpected weight changes Eye:  no recent significant change in vision Ear/Nose/Mouth/Throat:  Ears:  no tinnitus or vertigo and no recent change in hearing Nose/Mouth/Throat:  no complaints of nasal congestion, no sore throat Cardiovascular: no chest pain Respiratory:  no cough and no shortness of breath Gastrointestinal:  no abdominal pain, no change in bowel habits GU:  Female: negative for dysuria or pelvic pain Musculoskeletal/Extremities: +knee pain; otherwise no pain of the joints Integumentary (Skin/Breast):  no abnormal skin lesions reported Neurologic:  no headaches Endocrine:  denies fatigue Hematologic/Lymphatic:  No areas of easy bleeding  Exam BP 118/70 (BP Location: Left Arm, Patient Position: Sitting, Cuff Size: Normal)   Pulse 100   Temp 98.5 F (36.9 C) (Oral)   Ht 5\' 2"  (1.575 m)   Wt 140 lb 2 oz (63.6 kg)   SpO2 97%   BMI 25.63 kg/m  General:  well developed, well nourished, in no apparent distress Skin:  no significant moles, warts, or growths Head:  no masses, lesions, or tenderness Eyes:  pupils equal and round, sclera anicteric without injection Ears:  canals without lesions, TMs shiny without retraction, no obvious effusion,  no erythema Nose:  nares patent, septum midline, mucosa normal, and no drainage or sinus tenderness Throat/Pharynx:  lips and gingiva without lesion; tongue and uvula midline; non-inflamed pharynx; no exudates or postnasal drainage Neck: neck supple without adenopathy, thyromegaly, or masses Lungs:  clear to auscultation, breath sounds equal bilaterally, no respiratory distress Cardio:  regular rate and rhythm, no bruits, no LE edema Abdomen:  abdomen soft, nontender; bowel sounds normal; no masses or  organomegaly Genital: Defer to GYN Musculoskeletal:  symmetrical muscle groups noted without atrophy or deformity Extremities:  no clubbing, cyanosis, or edema, no deformities, no skin discoloration Neuro:  gait normal; deep tendon reflexes normal and symmetric Psych: well oriented with normal range of affect and appropriate judgment/insight  Assessment and Plan  Well adult exam - Plan: Comprehensive metabolic panel, Lipid panel  DEPRESSION/ANXIETY - Plan: venlafaxine XR (EFFEXOR-XR) 75 MG 24 hr capsule  Need for tetanus booster - Plan: Tdap vaccine greater than or equal to 7yo IM  Encounter for hepatitis C screening test for low risk patient - Plan: Hepatitis C antibody   Well 62 y.o. female. Counseled on diet and exercise. Discussed Shingrix and ideal time to get flu shot.  Other orders as above. Follow up in 6 mo or prn. The patient voiced understanding and agreement to the plan.  [place], DO 04/27/18 4:53 PM

## 2018-04-28 LAB — LIPID PANEL
CHOL/HDL RATIO: 2.7 (calc) (ref <5.0)
CHOLESTEROL: 205 mg/dL — AB (ref <200)
HDL: 75 mg/dL (ref >50)
LDL CHOLESTEROL (CALC): 108 mg/dL — AB
NON-HDL CHOLESTEROL (CALC): 130 mg/dL — AB (ref <130)
Triglycerides: 112 mg/dL (ref <150)

## 2018-04-28 LAB — HEPATITIS C ANTIBODY
HEP C AB: NONREACTIVE
SIGNAL TO CUT-OFF: 0.01 (ref <1.00)

## 2018-04-28 LAB — COMPREHENSIVE METABOLIC PANEL
AG Ratio: 1.5 (calc) (ref 1.0–2.5)
ALKALINE PHOSPHATASE (APISO): 108 U/L (ref 33–130)
ALT: 14 U/L (ref 6–29)
AST: 16 U/L (ref 10–35)
Albumin: 4.1 g/dL (ref 3.6–5.1)
BUN: 14 mg/dL (ref 7–25)
CHLORIDE: 101 mmol/L (ref 98–110)
CO2: 28 mmol/L (ref 20–32)
CREATININE: 0.96 mg/dL (ref 0.50–0.99)
Calcium: 10 mg/dL (ref 8.6–10.4)
GLOBULIN: 2.8 g/dL (ref 1.9–3.7)
Glucose, Bld: 114 mg/dL — ABNORMAL HIGH (ref 65–99)
POTASSIUM: 4 mmol/L (ref 3.5–5.3)
SODIUM: 140 mmol/L (ref 135–146)
TOTAL PROTEIN: 6.9 g/dL (ref 6.1–8.1)
Total Bilirubin: 0.3 mg/dL (ref 0.2–1.2)

## 2018-05-01 ENCOUNTER — Other Ambulatory Visit: Payer: Self-pay | Admitting: Obstetrics and Gynecology

## 2018-05-01 DIAGNOSIS — Z1231 Encounter for screening mammogram for malignant neoplasm of breast: Secondary | ICD-10-CM

## 2018-06-17 ENCOUNTER — Other Ambulatory Visit: Payer: Self-pay | Admitting: Family Medicine

## 2018-06-17 DIAGNOSIS — I10 Essential (primary) hypertension: Secondary | ICD-10-CM

## 2018-07-12 ENCOUNTER — Telehealth: Payer: Self-pay | Admitting: *Deleted

## 2018-07-12 NOTE — Telephone Encounter (Signed)
Received "Annual Age Appropriate Wellness Examination" form to be signed by provider that pt did received Physical on 04/27/18; forwarded to provider/SLS 11/07

## 2018-07-13 ENCOUNTER — Telehealth: Payer: Self-pay | Admitting: Family Medicine

## 2018-07-13 NOTE — Telephone Encounter (Signed)
Copied from [place] [id number]. Topic: Quick Communication - See Telephone Encounter >> Jul 13, 2018  8:35 AM Blase Mess A wrote: CRM for notification. See Telephone encounter for: 07/13/18. Patient is calling to see if a fax was received.  It was a wellness form. Please advise [phone]

## 2018-07-16 NOTE — Telephone Encounter (Signed)
Conversation  (Newest Message First)  Me    07/12/18 2:07 PM  Note    Received "Annual Age Appropriate Wellness Examination" form to be signed by provider that pt did received Physical on 04/27/18; forwarded to provider/SLS 11/07

## 2018-07-19 ENCOUNTER — Ambulatory Visit
Admission: RE | Admit: 2018-07-19 | Discharge: 2018-07-19 | Disposition: A | Discharge status: Home / Self Care | Payer: Self-pay | Source: Ambulatory Visit | Attending: Obstetrics and Gynecology | Admitting: Obstetrics and Gynecology

## 2018-07-19 ENCOUNTER — Encounter (HOSPITAL_COMMUNITY): Payer: Self-pay

## 2018-07-19 ENCOUNTER — Ambulatory Visit (HOSPITAL_COMMUNITY)
Admission: RE | Admit: 2018-07-19 | Discharge: 2018-07-19 | Disposition: A | Discharge status: Home / Self Care | Payer: PRIVATE HEALTH INSURANCE | Source: Ambulatory Visit | Attending: Obstetrics and Gynecology | Admitting: Obstetrics and Gynecology

## 2018-07-19 VITALS — BP 120/92 | Wt 142.0 lb

## 2018-07-19 DIAGNOSIS — Z1239 Encounter for other screening for malignant neoplasm of breast: Secondary | ICD-10-CM

## 2018-07-19 DIAGNOSIS — Z1231 Encounter for screening mammogram for malignant neoplasm of breast: Secondary | ICD-10-CM

## 2018-07-19 NOTE — Progress Notes (Signed)
No complaints today.   Pap Smear: Pap smear not completed today. Last Pap smear was in 2016 by Dr. Bettey Mare and normal per patient. Per patient has no history of an abnormal Pap smear. Patient has a history of a hysterectomy in 1998 due to fibroids. Patient doesn't need any further Pap smears due to her history of a hysterectomy for benign reasons per [facility] and ACOG guidelines. No Pap smear results are in EPIC.  Physical exam: Breasts Breasts symmetrical. No skin abnormalities bilateral breasts. No nipple retraction bilateral breasts. No nipple discharge bilateral breasts. No lymphadenopathy. No lumps palpated bilateral breasts. No complaints of pain or tenderness on exam. Referred patient to the [facility] for a screening mammogram. Appointment scheduled for Thursday, July 19, 2018 at 1110.     Pelvic/Bimanual No Pap smear completed today since patient has a history of a hysterectomy for benign reasons. Pap smear not indicated per [facility] guidelines.    Smoking History: Patient is a former smoker that quit in 1988.  Patient Navigation: Patient education provided. Access to services provided for patient through [facility] program.   Colorectal Cancer Screening: Patient had a colonoscopy completed 03/24/2017. No complaints today. FIT Test given to patient to complete and return to [facility].  Breast and Cervical Cancer Risk Assessment: Patient has a family history of her mother having breast cancer. Patient has no known genetic mutations or history of radiation treatment to the chest before age 26. Patient has no history of cervical dysplasia, immunocompromised, or DES exposure in-utero.  Risk Assessment    Risk Scores      07/19/2018   Last edited by: Armond Hang, LPN   5-year risk: 2 %   Lifetime risk: 8.7 %

## 2018-07-19 NOTE — Patient Instructions (Addendum)
Explained breast self awareness with [name]. Patient did not need a Pap smear today due to patient has a history of a hysterectomy for benign reasons. Let patient know that she doesn't need any further Pap smears due to her history of a hysterectomy for benign reasons. Referred patient to the [facility] for a screening mammogram. Appointment scheduled for Thursday, July 19, 2018 at 1110. Let [facility] know the [facility] will follow up with her within the next couple weeks with results of mammogram by letter or phone. [name] verbalized understanding.  Magdala Brahmbhatt, Arvil Chaco, RN 11:00 AM

## 2018-07-27 NOTE — Telephone Encounter (Signed)
Company sent 2nd request claiming not to have received first on 07/12/18; forwarded to provider for signature, will resend via fax/SLS 11/22

## 2018-07-30 ENCOUNTER — Encounter (HOSPITAL_COMMUNITY): Payer: Self-pay | Admitting: *Deleted

## 2018-09-23 ENCOUNTER — Other Ambulatory Visit: Payer: Self-pay | Admitting: Family Medicine

## 2018-09-23 DIAGNOSIS — I10 Essential (primary) hypertension: Secondary | ICD-10-CM

## 2018-09-24 ENCOUNTER — Telehealth: Payer: Self-pay | Admitting: Family Medicine

## 2018-09-24 NOTE — Telephone Encounter (Signed)
Copied from [place] [id number]. Topic: Referral - Request for Referral >> Sep 21, 2018 10:58 AM Alanda Slim E wrote: Has patient seen PCP for this complaint? Yes has mentioned before  *If NO, is insurance requiring patient see PCP for this issue before PCP can refer them? Referral for which specialty: Neurology  Preferred provider/office: [facility] Reason for referral: Nerve damage on left side of body  Called the patient left message to call back.

## 2018-09-24 NOTE — Telephone Encounter (Signed)
Called left message to call back 

## 2018-09-24 NOTE — Telephone Encounter (Signed)
Is she having worsening weakness? Why specifically now? Need reason for referral.

## 2018-09-25 NOTE — Telephone Encounter (Signed)
Patient returning call RE neurology referral.

## 2018-09-26 ENCOUNTER — Other Ambulatory Visit: Payer: Self-pay | Admitting: Family Medicine

## 2018-09-26 DIAGNOSIS — R531 Weakness: Secondary | ICD-10-CM

## 2018-09-26 NOTE — Progress Notes (Signed)
ambul

## 2018-09-26 NOTE — Telephone Encounter (Signed)
Refer for left sided weakness. TY.

## 2018-09-26 NOTE — Telephone Encounter (Signed)
SHE HAD a fall a few years ago. Actually slid on the ice holding her car door.  Since then the weakness/pain is on and off (left leg/up to the back of her neck). She was concerned about nerve damage

## 2018-09-26 NOTE — Telephone Encounter (Signed)
Referral done

## 2018-09-27 ENCOUNTER — Encounter: Payer: Self-pay | Admitting: Neurology

## 2018-10-30 ENCOUNTER — Other Ambulatory Visit: Payer: Self-pay | Admitting: Family Medicine

## 2018-10-30 ENCOUNTER — Ambulatory Visit: Payer: BLUE CROSS/BLUE SHIELD

## 2018-10-30 DIAGNOSIS — F341 Dysthymic disorder: Secondary | ICD-10-CM

## 2018-10-30 MED ORDER — VENLAFAXINE HCL ER 75 MG PO CP24: 75.0000 mg | ORAL_CAPSULE | Freq: Every day | ORAL | 0 refills | Status: DC

## 2018-10-30 NOTE — Telephone Encounter (Signed)
Requested medication (s) are due for refill today: Yes  Requested medication (s) are on the active medication list: Yes  Last refill:  04/27/18  Future visit scheduled: No  Notes to clinic:  Failed items on protocol, unable to refill     Requested Prescriptions  Pending Prescriptions Disp Refills   venlafaxine XR (EFFEXOR-XR) 75 MG 24 hr capsule 90 capsule 1    Sig: Take 1 tab daily.     Psychiatry: Antidepressants - SNRI - desvenlafaxine & venlafaxine Failed - 10/30/2018 10:53 AM      Failed - LDL in normal range and within 360 days    LDL Cholesterol (Calc)  Date Value Ref Range Status  04/27/2018 108 (H) mg/dL (calc) Final    Comment:    Reference range: <100 . Desirable range <100 mg/dL for primary prevention;   <70 mg/dL for patients with CHD or diabetic patients  with > or = 2 CHD risk factors. Marland Kitchen LDL-C is now calculated using the Martin-Hopkins  calculation, which is a validated novel method providing  better accuracy than the Friedewald equation in the  estimation of LDL-C.  Cresenciano Genre et al. Annamaria Helling. 6712;458(09): 2061-2068  (http://education.[facility].com/faq/FAQ164)          Failed - Total Cholesterol in normal range and within 360 days    Cholesterol  Date Value Ref Range Status  04/27/2018 205 (H) <200 mg/dL Final         Failed - Completed PHQ-2 or PHQ-9 in the last 360 days.      Failed - Last BP in normal range    BP Readings from Last 1 Encounters:  07/19/18 (!) 120/92         Failed - Valid encounter within last 6 months    Recent Outpatient Visits          6 months ago Well adult exam   [facility], [place], [place]   8 months ago Tick bite, initial encounter   [facility] [place], [place]   9 months ago Strain of left trapezius muscle, initial encounter   [facility] at [facility] [place], [place]   1 year  ago Essential hypertension   [facility] at [facility], [place], [place]             Passed - Triglycerides in normal range and within 360 days    Triglycerides  Date Value Ref Range Status  04/27/2018 112 <150 mg/dL Final

## 2018-10-30 NOTE — Telephone Encounter (Signed)
Copied from [place] [id number]. Topic: Quick Communication - Rx Refill/Question >> Oct 30, 2018 10:49 AM Waylan Rocher, Lumin L wrote: Medication: venlafaxine XR (EFFEXOR-XR) 75 MG 24 hr capsule   Has the patient contacted their pharmacy? Yes.   (Agent: If no, request that the patient contact the pharmacy for the refill.) (Agent: If yes, when and what did the pharmacy advise?) [employer] friendly  contacted the practice already and patient has not been updated by them. Switching to [employer].  Preferred Pharmacy (with phone number or street name): [facility] [address] [place] - [place] [address] [place] [address] Phone: [phone] Fax: [phone]  Agent: Please be advised that RX refills may take up to 3 business days. We ask that you follow-up with your pharmacy.

## 2018-11-27 ENCOUNTER — Other Ambulatory Visit: Payer: Self-pay | Admitting: Family Medicine

## 2018-11-27 DIAGNOSIS — F341 Dysthymic disorder: Secondary | ICD-10-CM

## 2018-11-27 MED ORDER — VENLAFAXINE HCL ER 75 MG PO CP24: 75.0000 mg | ORAL_CAPSULE | Freq: Every day | ORAL | 0 refills | Status: DC

## 2018-11-30 ENCOUNTER — Encounter: Payer: BLUE CROSS/BLUE SHIELD | Admitting: Family Medicine

## 2018-11-30 ENCOUNTER — Ambulatory Visit: Payer: PRIVATE HEALTH INSURANCE | Admitting: Neurology

## 2018-12-16 ENCOUNTER — Other Ambulatory Visit: Payer: Self-pay | Admitting: Family Medicine

## 2018-12-16 DIAGNOSIS — I10 Essential (primary) hypertension: Secondary | ICD-10-CM

## 2018-12-18 NOTE — Telephone Encounter (Signed)
Called left message to schedule Virtual Visit for appt. Patient was last seen in 04/2018 was instructed to return in 10/2018 for a followup, but did not. Will deny refill request based on need for appt.

## 2018-12-19 ENCOUNTER — Encounter: Payer: Self-pay | Admitting: Family Medicine

## 2018-12-19 ENCOUNTER — Other Ambulatory Visit: Payer: Self-pay

## 2018-12-19 ENCOUNTER — Ambulatory Visit (INDEPENDENT_AMBULATORY_CARE_PROVIDER_SITE_OTHER): Payer: BC Managed Care – PPO | Admitting: Family Medicine

## 2018-12-19 DIAGNOSIS — I10 Essential (primary) hypertension: Secondary | ICD-10-CM | POA: Diagnosis not present

## 2018-12-19 DIAGNOSIS — F341 Dysthymic disorder: Secondary | ICD-10-CM

## 2018-12-19 MED ORDER — LISINOPRIL 40 MG PO TABS: 40.0000 mg | ORAL_TABLET | Freq: Every day | ORAL | 2 refills | Status: DC

## 2018-12-19 NOTE — Progress Notes (Signed)
CC: Med ck  Subjective Casey Frazier is a 63 y.o. female who presents for hypertension follow up. She does not monitor home blood pressures. She is compliant with medication- lisinopril 40 mg/d. Patient has these side effects of medication: none She is sometimes adhering to a healthy diet overall. Current exercise: walking  Hx of anxiety/depression, currently on Effexor 75 mg/d. Doing well, reports compliance. No AE's. No SI or HI.    Past Medical History:  Diagnosis Date  . Allergy   . Anxiety   . Colon polyps   . Depression   . GERD (gastroesophageal reflux disease)   . Hayfever   . Headache    migraines  . Heart murmur   . History of colon polyps   . Hypertension   . Migraines     Review of Systems Cardiovascular: no chest pain Respiratory:  no shortness of breath  Exam No conversational dyspnea Age appropriate judgment and insight Nml affect and mood  Essential hypertension  DEPRESSION/ANXIETY  Cont ACEi. Counseled on diet and exercise. Cont SNRI.  F/u in 6 mo for CPE. The patient voiced understanding and agreement to the plan.  Francis [place], DO 12/19/18  3:23 PM

## 2019-02-05 ENCOUNTER — Other Ambulatory Visit: Payer: Self-pay

## 2019-02-07 ENCOUNTER — Ambulatory Visit (INDEPENDENT_AMBULATORY_CARE_PROVIDER_SITE_OTHER): Payer: BC Managed Care – PPO | Admitting: Obstetrics and Gynecology

## 2019-02-07 ENCOUNTER — Other Ambulatory Visit: Payer: Self-pay

## 2019-02-07 ENCOUNTER — Encounter: Payer: Self-pay | Admitting: Obstetrics and Gynecology

## 2019-02-07 VITALS — BP 118/70 | HR 68 | Temp 98.1°F | Ht 61.0 in | Wt 134.8 lb

## 2019-02-07 DIAGNOSIS — N951 Menopausal and female climacteric states: Secondary | ICD-10-CM

## 2019-02-07 DIAGNOSIS — Z79890 Hormone replacement therapy (postmenopausal): Secondary | ICD-10-CM

## 2019-02-07 DIAGNOSIS — I83813 Varicose veins of bilateral lower extremities with pain: Secondary | ICD-10-CM | POA: Diagnosis not present

## 2019-02-07 MED ORDER — ESTRADIOL 0.025 MG/24HR TD PTTW: 1.0000 | MEDICATED_PATCH | TRANSDERMAL | 1 refills | Status: DC

## 2019-02-07 NOTE — Patient Instructions (Addendum)
Menopause and Hormone Replacement Therapy What is hormone replacement therapy?  Hormone replacement therapy (HRT) is the use of artificial (synthetic) hormones to replace hormones that your body stops producing during menopause. Menopause is the normal time of life when menstrual periods stop completely and the ovaries stop producing the female hormones estrogen and progesterone. This lack of hormones can affect your health and cause undesirable symptoms. HRT can relieve some of those symptoms. What are my options for HRT? HRT may consist of the synthetic hormones estrogen and progestin, or it may consist of only estrogen (estrogen-only therapy). You and your health care provider will decide which form of HRT is best for you. If you choose to be on HRT and you have a uterus, estrogen and progestin are usually prescribed. Estrogen-only therapy is used for women who do not have a uterus. Possible options for taking HRT include:  Pills.  Patches.  Gels.  Sprays.  Vaginal cream.  Vaginal rings.  Vaginal inserts. The amount of hormone(s) that you take and how long you take the hormone(s) varies depending on your individual health. It is important to:  Begin HRT with the lowest possible dosage.  Stop HRT as soon as your health care provider tells you to stop.  Work with your health care provider so that you feel informed and comfortable with your decisions. What are the benefits of HRT? HRT can reduce the frequency and severity of menopausal symptoms. Benefits of HRT vary depending on the menopausal symptoms that you have, the severity of your symptoms, and your overall health. HRT may help to improve the following menopausal symptoms:  Hot flashes and night sweats. These are sudden feelings of heat that spread over the face and body. The skin may turn red, like a blush. Night sweats are hot flashes that happen while you are sleeping or trying to sleep.  Bone loss (osteoporosis). The  body loses calcium more quickly after menopause, causing the bones to become weaker. This can increase the risk for bone breaks (fractures).  Vaginal dryness. The lining of the vagina can become thin and dry, which can cause pain during sexual intercourse or cause infection, burning, or itching.  Urinary tract infections.  Urinary incontinence. This is a decreased ability to control when you urinate.  Irritability.  Short-term memory problems. What are the risks of HRT? Risks of HRT vary depending on your individual health and medical history. Risks of HRT also depend on whether you receive both estrogen and progestin or you receive estrogen only.HRT may increase the risk of:  Spotting. This is when a small amount of bloodleaks from the vagina unexpectedly.  Endometrial cancer. This cancer is in the lining of the uterus (endometrium).  Breast cancer.  Increased density of breast tissue. This can make it harder to find breast cancer on a breast X-ray (mammogram).  Stroke.  Heart attack.  Blood clots.  Gallbladder disease. Risks of HRT can increase if you have any of the following conditions:  Endometrial cancer.  Liver disease.  Heart disease.  Breast cancer.  History of blood clots.  History of stroke. How should I care for myself while I am on HRT?  Take over-the-counter and prescription medicines only as told by your health care provider.  Get mammograms, pelvic exams, and medical checkups as often as told by your health care provider.  Have Pap tests done as often as told by your health care provider. A Pap test is sometimes called a Pap smear. It is a   screening test that is used to check for signs of cancer of the cervix and vagina. A Pap test can also identify the presence of infection or precancerous changes. Pap tests may be done: ? Every 3 years, starting at age 90. ? Every 5 years, starting after age 65, in combination with testing for human papillomavirus  (HPV). ? More often or less often depending on other medical conditions you have, your age, and other risk factors.  It is your responsibility to get your Pap test results. Ask your health care provider or the department performing the test when your results will be ready.  Keep all follow-up visits as told by your health care provider. This is important. When should I seek medical care? Talk with your health care provider if:  You have any of these: ? Pain or swelling in your legs. ? Shortness of breath. ? Chest pain. ? Lumps or changes in your breasts or armpits. ? Slurred speech. ? Pain, burning, or bleeding when you urine.  You develop any of these: ? Unusual vaginal bleeding. ? Dizziness or headaches. ? Weakness or numbness in any part of your arms or legs. ? Pain in your abdomen. This information is not intended to replace advice given to you by your health care provider. Make sure you discuss any questions you have with your health care provider. Document Released: 05/21/2003 Document Revised: 04/06/2017 Document Reviewed: 02/23/2015 Elsevier Interactive Patient Education  2019 [employer]. Diverticulosis  Diverticulosis is a condition that develops when small pouches (diverticula) form in the wall of the large intestine (colon). The colon is where water is absorbed and stool is formed. The pouches form when the inside layer of the colon pushes through weak spots in the outer layers of the colon. You may have a few pouches or many of them. What are the causes? The cause of this condition is not known. What increases the risk? The following factors may make you more likely to develop this condition:  Being older than age 55. Your risk for this condition increases with age. Diverticulosis is rare among people younger than age 16. By age 92, many people have it.  Eating a low-fiber diet.  Having frequent constipation.  Being overweight.  Not getting enough exercise.   Smoking.  Taking over-the-counter pain medicines, like aspirin and ibuprofen.  Having a family history of diverticulosis. What are the signs or symptoms? In most people, there are no symptoms of this condition. If you do have symptoms, they may include:  Bloating.  Cramps in the abdomen.  Constipation or diarrhea.  Pain in the lower left side of the abdomen. How is this diagnosed? This condition is most often diagnosed during an exam for other colon problems. Because diverticulosis usually has no symptoms, it often cannot be diagnosed independently. This condition may be diagnosed by:  Using a flexible scope to examine the colon (colonoscopy).  Taking an X-ray of the colon after dye has been put into the colon (barium enema).  Doing a CT scan. How is this treated? You may not need treatment for this condition if you have never developed an infection related to diverticulosis. If you have had an infection before, treatment may include:  Eating a high-fiber diet. This may include eating more fruits, vegetables, and grains.  Taking a fiber supplement.  Taking a live bacteria supplement (probiotic).  Taking medicine to relax your colon.  Taking antibiotic medicines. Follow these instructions at home:  Drink 6-8 glasses of water  or more each day to prevent constipation.  Try not to strain when you have a bowel movement.  If you have had an infection before: ? Eat more fiber as directed by your health care provider or your diet and nutrition specialist (dietitian). ? Take a fiber supplement or probiotic, if your health care provider approves.  Take over-the-counter and prescription medicines only as told by your health care provider.  If you were prescribed an antibiotic, take it as told by your health care provider. Do not stop taking the antibiotic even if you start to feel better.  Keep all follow-up visits as told by your health care provider. This is important.  Contact a health care provider if:  You have pain in your abdomen.  You have bloating.  You have cramps.  You have not had a bowel movement in 3 days. Get help right away if:  Your pain gets worse.  Your bloating becomes very bad.  You have a fever or chills, and your symptoms suddenly get worse.  You vomit.  You have bowel movements that are bloody or black.  You have bleeding from your rectum. Summary  Diverticulosis is a condition that develops when small pouches (diverticula) form in the wall of the large intestine (colon).  You may have a few pouches or many of them.  This condition is most often diagnosed during an exam for other colon problems.  If you have had an infection related to diverticulosis, treatment may include increasing the fiber in your diet, taking supplements, or taking medicines. This information is not intended to replace advice given to you by your health care provider. Make sure you discuss any questions you have with your health care provider. Document Released: 05/19/2004 Document Revised: 07/11/2016 Document Reviewed: 07/11/2016 Elsevier Interactive Patient Education  2019 [employer].

## 2019-02-07 NOTE — Progress Notes (Signed)
63 y.o. G2P0020 Single Black or African American Not Hispanic or Latino female here for menopausal symptoms.   The patient had a TAH/BSO in 1999. She was on ERT up until 2017. After going off ERT she started having vasomotor symptoms. Symptoms seem to have gotten worse. She is having 8-10 hot flashes a day, no significant night sweats. Sleeping okay. She is sweating so much during the day because she is sweating so much.  Sexually active, no pain. Same partner for ~3 years.  Mood controlled with Effexor.  She is getting her her breast and pelvic exams as part of [facility] on [employer].    No LMP recorded. Patient has had a hysterectomy.          Sexually active: Yes.    The current method of family planning is status post hysterectomy.    Exercising: No.  The patient does not participate in regular exercise at present. Smoker:  no  Health Maintenance: Pap:  Before 2000 WNL History of abnormal Pap:  no MMG:  07/19/2018 birads 1 negative  BMD:   Thinks she has had one many years ago Colonoscopy: 03/24/2017 diverticula (she was never told about this, information given) TDaP:  04/27/2018 Gardasil: N/A   reports that she quit smoking about 32 years ago. She has never used smokeless tobacco. She reports that she does not drink alcohol or use drugs. Works in [place].   Past Medical History:  Diagnosis Date  . Allergy   . Anxiety   . Colon polyps   . Depression   . Fibroid    history of uterine fibroids  . GERD (gastroesophageal reflux disease)   . Hayfever   . Headache    migraines  . Heart murmur   . History of colon polyps   . Hypertension   . Migraines   No migraines for 4-5 years.   Past Surgical History:  Procedure Laterality Date  . ABDOMINAL HYSTERECTOMY  1999   complete  . BREAST BIOPSY Left    benign  . COLONOSCOPY WITH PROPOFOL N/A 03/24/2017   Procedure: COLONOSCOPY WITH PROPOFOL;  Surgeon: Doran Stabler, MD;  Location: [facility];  Service: Gastroenterology;   Laterality: N/A;  . colonscopy     with polyps removed  . ESOPHAGOGASTRODUODENOSCOPY (EGD) WITH PROPOFOL N/A 03/24/2017   Procedure: ESOPHAGOGASTRODUODENOSCOPY (EGD) WITH PROPOFOL;  Surgeon: Doran Stabler, MD;  Location: [facility];  Service: Gastroenterology;  Laterality: N/A;  . FOOT SURGERY      Current Outpatient Medications  Medication Sig Dispense Refill  . acetaminophen (TYLENOL) 500 MG tablet Take 250 mg by mouth at bedtime as needed for moderate pain or headache.    . APPLE CIDER VINEGAR PO Take by mouth daily.    . Coenzyme Q10 (CO Q 10 PO) Take by mouth daily.    . diphenhydrAMINE (BENADRYL) 25 MG tablet Take 25 mg by mouth daily as needed for allergies.    Marland Kitchen lisinopril (PRINIVIL,ZESTRIL) 40 MG tablet Take 1 tablet (40 mg total) by mouth daily. 90 tablet 2  . Multiple Vitamin (MULTIVITAMIN WITH MINERALS) TABS tablet Take 1 tablet by mouth daily.    . naphazoline (NAPHCON) 0.1 % ophthalmic solution Place 2 drops into both eyes 3 (three) times daily as needed for eye irritation.    . Omega-3 Fatty Acids (FISH OIL PO) Take by mouth daily.    Marland Kitchen venlafaxine XR (EFFEXOR-XR) 75 MG 24 hr capsule Take 1 capsule (75 mg total) by mouth daily  with breakfast. 90 capsule 0   No current facility-administered medications for this visit.     Family History  Problem Relation Age of Onset  . Breast cancer Mother 61  . Lung cancer Mother   . Cancer Mother        Breast  . Hypertension Mother   . Hypertension Maternal Aunt   . Liver cancer Father        probable  . Cancer Father        Colon  . Hypertension Father   . Ulcerative colitis Sister   . Clotting disorder Paternal Grandmother   . Clotting disorder Paternal Uncle   Mom with breast cancer at 69.   Review of Systems  Constitutional: Negative.   HENT: Negative.   Eyes: Negative.   Respiratory: Negative.   Cardiovascular: Negative.   Gastrointestinal: Negative.   Endocrine: Negative.   Genitourinary:       Hot  flashes  Musculoskeletal: Negative.   Skin: Negative.   Allergic/Immunologic: Negative.   Neurological: Negative.   Hematological: Negative.   Psychiatric/Behavioral: Negative.     Exam:   BP 118/70 (BP Location: Left Arm, Patient Position: Sitting, Cuff Size: Normal)   Pulse 68   Temp 98.1 F (36.7 C) (Skin)   Ht 5\' 1"  (1.549 m)   Wt 134 lb 12.8 oz (61.1 kg)   BMI 25.47 kg/m   Weight change: @WEIGHTCHANGE @ Height:   Height: 5\' 1"  (154.9 cm)  Ht Readings from Last 3 Encounters:  02/07/19 5\' 1"  (1.549 m)  04/27/18 5\' 2"  (1.575 m)  03/05/18 5\' 2"  (1.575 m)    General appearance: alert, cooperative and appears stated age Head: Normocephalic, without obvious abnormality, atraumatic Neck: no adenopathy, supple, symmetrical, trachea midline and thyroid normal to inspection and palpation Lungs: clear to auscultation bilaterally Cardiovascular: regular rate and rhythm Abdomen: soft, non-tender; non distended,  no masses,  no organomegaly Extremities: extremities normal, atraumatic, no cyanosis or edema Skin: Skin color, texture, turgor normal. No rashes or lesions Lymph nodes: Cervical, supraclavicular nodes normal. No abnormal inguinal nodes palpated Neurologic: Grossly normal   A:  Vasomotor symptoms after going off of ERT, worsening. All ready on Effexor.     P:   Discussed behavioral changes and avoiding triggers  Discussed the option of ERT (doesn't need progesterone), increasing her Effexor and gabapentin. Reviewed the risks of HRT, no absolute contraindications, information given  She would like to start ERT  Will start low dose estrogen patch, 0.025 mg  F/U in one month  She is getting her breast and pelvic exams through [facility]  Discussed option of getting her annual exams her with her HRT or checking if she can get her H[facility] through [facility]

## 2019-02-23 ENCOUNTER — Other Ambulatory Visit: Payer: Self-pay | Admitting: Family Medicine

## 2019-02-23 DIAGNOSIS — F341 Dysthymic disorder: Secondary | ICD-10-CM

## 2019-03-18 ENCOUNTER — Telehealth: Payer: Self-pay | Admitting: Obstetrics and Gynecology

## 2019-03-18 NOTE — Progress Notes (Deleted)
GYNECOLOGY  VISIT   HPI: 63 y.o.   Single Black or African American Not Hispanic or Latino  female   [phone] with No LMP recorded. Patient has had a hysterectomy.   here for     GYNECOLOGIC HISTORY: No LMP recorded. Patient has had a hysterectomy. Contraception:*** Menopausal hormone therapy: ***        OB History    Gravida  2   Para  0   Term  0   Preterm  0   AB  2   Living  0     SAB  0   TAB  2   Ectopic  0   Multiple  0   Live Births                 Patient Active Problem List   Diagnosis Date Noted  . Numbness 03/05/2018  . ANXIETY 03/19/2007  . Essential hypertension 03/19/2007  . HEADACHE 03/19/2007  . DEPRESSION/ANXIETY 01/22/2007    Past Medical History:  Diagnosis Date  . Allergy   . Anxiety   . Colon polyps   . Depression   . Fibroid    history of uterine fibroids  . GERD (gastroesophageal reflux disease)   . Hayfever   . Headache    migraines  . Heart murmur   . History of colon polyps   . Hypertension   . Migraines     Past Surgical History:  Procedure Laterality Date  . ABDOMINAL HYSTERECTOMY  1999   complete  . BREAST BIOPSY Left    benign  . COLONOSCOPY WITH PROPOFOL N/A 03/24/2017   Procedure: COLONOSCOPY WITH PROPOFOL;  Surgeon: Doran Stabler, MD;  Location: [facility];  Service: Gastroenterology;  Laterality: N/A;  . colonscopy     with polyps removed  . ESOPHAGOGASTRODUODENOSCOPY (EGD) WITH PROPOFOL N/A 03/24/2017   Procedure: ESOPHAGOGASTRODUODENOSCOPY (EGD) WITH PROPOFOL;  Surgeon: Doran Stabler, MD;  Location: [facility];  Service: Gastroenterology;  Laterality: N/A;  . FOOT SURGERY      Current Outpatient Medications  Medication Sig Dispense Refill  . acetaminophen (TYLENOL) 500 MG tablet Take 250 mg by mouth at bedtime as needed for moderate pain or headache.    . APPLE CIDER VINEGAR PO Take by mouth daily.    . Coenzyme Q10 (CO Q 10 PO) Take by mouth daily.    . diphenhydrAMINE (BENADRYL)  25 MG tablet Take 25 mg by mouth daily as needed for allergies.    Marland Kitchen estradiol (VIVELLE-DOT) 0.025 MG/24HR Place 1 patch onto the skin 2 (two) times a week. 8 patch 1  . lisinopril (PRINIVIL,ZESTRIL) 40 MG tablet Take 1 tablet (40 mg total) by mouth daily. 90 tablet 2  . Multiple Vitamin (MULTIVITAMIN WITH MINERALS) TABS tablet Take 1 tablet by mouth daily.    . naphazoline (NAPHCON) 0.1 % ophthalmic solution Place 2 drops into both eyes 3 (three) times daily as needed for eye irritation.    . Omega-3 Fatty Acids (FISH OIL PO) Take by mouth daily.    Marland Kitchen venlafaxine XR (EFFEXOR-XR) 75 MG 24 hr capsule TAKE 1 CAPSULE BY MOUTH DAILY WITH BREAKFAST **NEEDS OFFICE VISIT** 90 capsule 0   No current facility-administered medications for this visit.      ALLERGIES: Patient has no known allergies.  Family History  Problem Relation Age of Onset  . Breast cancer Mother 34  . Lung cancer Mother   . Cancer Mother        Breast  .  Hypertension Mother   . Hypertension Maternal Aunt   . Liver cancer Father        probable  . Cancer Father        Colon  . Hypertension Father   . Ulcerative colitis Sister   . Clotting disorder Paternal Grandmother   . Clotting disorder Paternal Uncle     Social History   Socioeconomic History  . Marital status: Single    Spouse name: Not on file  . Number of children: 0  . Years of education: Not on file  . Highest education level: Not on file  Occupational History  . Not on file  Social Needs  . Financial resource strain: Not on file  . Food insecurity    Worry: Not on file    Inability: Not on file  . Transportation needs    Medical: Not on file    Non-medical: Not on file  Tobacco Use  . Smoking status: Former Smoker    Quit date: 09/05/1986    Years since quitting: 32.5  . Smokeless tobacco: Never Used  . Tobacco comment: college years  Substance and Sexual Activity  . Alcohol use: No  . Drug use: No  . Sexual activity: Yes    Birth  control/protection: Surgical    Comment: hysterectomy   Lifestyle  . Physical activity    Days per week: Not on file    Minutes per session: Not on file  . Stress: Not on file  Relationships  . Social Herbalist on phone: Not on file    Gets together: Not on file    Attends religious service: Not on file    Active member of club or organization: Not on file    Attends meetings of clubs or organizations: Not on file    Relationship status: Not on file  . Intimate partner violence    Fear of current or ex partner: Not on file    Emotionally abused: Not on file    Physically abused: Not on file    Forced sexual activity: Not on file  Other Topics Concern  . Not on file  Social History Narrative  . Not on file    ROS  PHYSICAL EXAMINATION:    There were no vitals taken for this visit.    General appearance: alert, cooperative and appears stated age Neck: no adenopathy, supple, symmetrical, trachea midline and thyroid {CHL AMB PHY EX THYROID NORM DEFAULT:[id number]::"normal to inspection and palpation"} Breasts: {Exam; breast:13139::"normal appearance, no masses or tenderness"} Abdomen: soft, non-tender; non distended, no masses,  no organomegaly  Pelvic: External genitalia:  no lesions              Urethra:  normal appearing urethra with no masses, tenderness or lesions              Bartholins and Skenes: normal                 Vagina: normal appearing vagina with normal color and discharge, no lesions              Cervix: {CHL AMB PHY EX CERVIX NORM DEFAULT:[phone]::"no lesions"}              Bimanual Exam:  Uterus:  {CHL AMB PHY EX UTERUS NORM DEFAULT:[phone]::"normal size, contour, position, consistency, mobility, non-tender"}              Adnexa: {CHL AMB PHY EX ADNEXA NO MASS [phone]::"no mass, fullness, tenderness"}  Rectovaginal: {yes no:[id number]}.  Confirms.              Anus:  normal sphincter tone, no lesions  Chaperone was  present for exam.  ASSESSMENT     PLAN    An After Visit Summary was printed and given to the patient.  *** minutes face to face time of which over 50% was spent in counseling.

## 2019-03-18 NOTE — Telephone Encounter (Signed)
Patient canceled her upcoming 1 month recheck appointment 03/20/19. She did not wish to reschedule.

## 2019-03-20 ENCOUNTER — Ambulatory Visit: Payer: BC Managed Care – PPO | Admitting: Obstetrics and Gynecology

## 2019-05-25 ENCOUNTER — Other Ambulatory Visit: Payer: Self-pay | Admitting: Family Medicine

## 2019-05-25 DIAGNOSIS — F341 Dysthymic disorder: Secondary | ICD-10-CM

## 2019-06-21 ENCOUNTER — Encounter: Payer: Self-pay | Admitting: Family Medicine

## 2019-06-21 ENCOUNTER — Ambulatory Visit (INDEPENDENT_AMBULATORY_CARE_PROVIDER_SITE_OTHER): Payer: BC Managed Care – PPO | Admitting: Family Medicine

## 2019-06-21 ENCOUNTER — Other Ambulatory Visit: Payer: Self-pay

## 2019-06-21 VITALS — BP 122/82 | HR 67 | Temp 98.0°F | Ht 62.0 in | Wt 130.2 lb

## 2019-06-21 DIAGNOSIS — I10 Essential (primary) hypertension: Secondary | ICD-10-CM

## 2019-06-21 DIAGNOSIS — Z0000 Encounter for general adult medical examination without abnormal findings: Secondary | ICD-10-CM

## 2019-06-21 LAB — COMPREHENSIVE METABOLIC PANEL
ALT: 17 U/L (ref 0–35)
AST: 17 U/L (ref 0–37)
Albumin: 4.3 g/dL (ref 3.5–5.2)
Alkaline Phosphatase: 103 U/L (ref 39–117)
BUN: 11 mg/dL (ref 6–23)
CO2: 28 mEq/L (ref 19–32)
Calcium: 10 mg/dL (ref 8.4–10.5)
Chloride: 103 mEq/L (ref 96–112)
Creatinine, Ser: 0.93 mg/dL (ref 0.40–1.20)
GFR: 73.71 mL/min (ref >60.00)
Glucose, Bld: 91 mg/dL (ref 70–99)
Potassium: 4 mEq/L (ref 3.5–5.1)
Sodium: 140 mEq/L (ref 135–145)
Total Bilirubin: 0.3 mg/dL (ref 0.2–1.2)
Total Protein: 6.8 g/dL (ref 6.0–8.3)

## 2019-06-21 LAB — CBC
HCT: 41 % (ref 36.0–46.0)
Hemoglobin: 13.5 g/dL (ref 12.0–15.0)
MCHC: 33 g/dL (ref 30.0–36.0)
MCV: 90.3 fl (ref 78.0–100.0)
Platelets: 301 10*3/uL (ref 150.0–400.0)
RBC: 4.54 Mil/uL (ref 3.87–5.11)
RDW: 12.4 % (ref 11.5–15.5)
WBC: 6.9 10*3/uL (ref 4.0–10.5)

## 2019-06-21 LAB — LIPID PANEL
Cholesterol: 182 mg/dL (ref 0–200)
HDL: 59.6 mg/dL (ref >39.00)
LDL Cholesterol: 102 mg/dL — ABNORMAL HIGH (ref 0–99)
NonHDL: 122.51
Total CHOL/HDL Ratio: 3
Triglycerides: 101 mg/dL (ref 0.0–149.0)
VLDL: 20.2 mg/dL (ref 0.0–40.0)

## 2019-06-21 MED ORDER — LISINOPRIL 40 MG PO TABS: 40.0000 mg | ORAL_TABLET | Freq: Every day | ORAL | 2 refills | Status: DC

## 2019-06-21 NOTE — Patient Instructions (Addendum)
Give [place] 2-3 business days to get the results of your labs back.   Keep the diet clean and stay active.  The new Shingrix vaccine (for shingles) is a 2 shot series. It can make people feel low energy, achy and almost like they have the flu for 48 hours after injection. Please plan accordingly when deciding on when to get this shot. Call your pharmacy to get this shot. The second shot of the series is less severe regarding the side effects, but it still lasts 48 hours.   Let us know if you need anything.

## 2019-06-21 NOTE — Progress Notes (Signed)
Chief Complaint  Patient presents with  . Annual Exam  . Fall    fell in the bathtub     Well Woman Casey Frazier is here for a complete physical.   Her last physical was >1 year ago.  Current diet: in general, a "good" diet. Current exercise: active at home. Weight is down 10 lbs and she denies daytime fatigue. No LMP recorded. Patient has had a hysterectomy. Seatbelt? Yes  Health Maintenance Mammogram- Yes Colon cancer screening-Yes Shingrix- No Tetanus- Yes Hep C screening- Yes HIV screening- Yes  Past Medical History:  Diagnosis Date  . Allergy   . Anxiety   . Colon polyps   . Depression   . Fibroid    history of uterine fibroids  . GERD (gastroesophageal reflux disease)   . Hayfever   . Headache    migraines  . Heart murmur   . History of colon polyps   . Hypertension   . Migraines      Past Surgical History:  Procedure Laterality Date  . ABDOMINAL HYSTERECTOMY  1999   complete  . BREAST BIOPSY Left    benign  . COLONOSCOPY WITH PROPOFOL N/A 03/24/2017   Procedure: COLONOSCOPY WITH PROPOFOL;  Surgeon: Doran Stabler, MD;  Location: [facility];  Service: Gastroenterology;  Laterality: N/A;  . colonscopy     with polyps removed  . ESOPHAGOGASTRODUODENOSCOPY (EGD) WITH PROPOFOL N/A 03/24/2017   Procedure: ESOPHAGOGASTRODUODENOSCOPY (EGD) WITH PROPOFOL;  Surgeon: Doran Stabler, MD;  Location: [facility];  Service: Gastroenterology;  Laterality: N/A;  . FOOT SURGERY      Medications  Current Outpatient Medications on File Prior to Visit  Medication Sig Dispense Refill  . acetaminophen (TYLENOL) 500 MG tablet Take 250 mg by mouth at bedtime as needed for moderate pain or headache.    . APPLE CIDER VINEGAR PO Take by mouth daily.    . Coenzyme Q10 (CO Q 10 PO) Take by mouth daily.    . diphenhydrAMINE (BENADRYL) 25 MG tablet Take 25 mg by mouth daily as needed for allergies.    Marland Kitchen estradiol (VIVELLE-DOT) 0.025 MG/24HR Place 1 patch onto the  skin 2 (two) times a week. 8 patch 1  . lisinopril (PRINIVIL,ZESTRIL) 40 MG tablet Take 1 tablet (40 mg total) by mouth daily. 90 tablet 2  . Multiple Vitamin (MULTIVITAMIN WITH MINERALS) TABS tablet Take 1 tablet by mouth daily.    . naphazoline (NAPHCON) 0.1 % ophthalmic solution Place 2 drops into both eyes 3 (three) times daily as needed for eye irritation.    . Omega-3 Fatty Acids (FISH OIL PO) Take by mouth daily.    Marland Kitchen venlafaxine XR (EFFEXOR-XR) 75 MG 24 hr capsule TAKE ONE CAPSULE BY MOUTH ONE TIME DAILY WITH BREAKFAST *NEEDS OFFICE VISIT* 90 capsule 0   Allergies No Known Allergies  Review of Systems: Constitutional:  no unexpected weight changes Eye:  no recent significant change in vision Ear/Nose/Mouth/Throat:  Ears:  no recent change in hearing Nose/Mouth/Throat:  no complaints of nasal congestion, no sore throat Cardiovascular: no chest pain Respiratory:  no shortness of breath Gastrointestinal:  no abdominal pain, no change in bowel habits GU:  Female: negative for dysuria or pelvic pain Musculoskeletal/Extremities:  no pain of the joints Integumentary (Skin/Breast):  no abnormal skin lesions reported Neurologic:  no headaches Endocrine:  denies fatigue Hematologic/Lymphatic:  No areas of easy bleeding  Exam BP 122/82 (BP Location: Left Arm, Patient Position: Sitting, Cuff Size: Normal)  Pulse 67   Temp 98 F (36.7 C) (Temporal)   Ht 5\' 2"  (1.575 m)   Wt 130 lb 4 oz (59.1 kg)   SpO2 98%   BMI 23.82 kg/m  General:  well developed, well nourished, in no apparent distress Skin:  no significant moles, warts, or growths Head:  no masses, lesions, or tenderness Eyes:  pupils equal and round, sclera anicteric without injection Ears:  canals without lesions, TMs shiny without retraction, no obvious effusion, no erythema Nose:  nares patent, septum midline, mucosa normal, and no drainage or sinus tenderness Throat/Pharynx:  lips and gingiva without lesion; tongue and  uvula midline; non-inflamed pharynx; no exudates or postnasal drainage Neck: neck supple without adenopathy, thyromegaly, or masses Lungs:  clear to auscultation, breath sounds equal bilaterally, no respiratory distress Cardio:  regular rate and rhythm, no LE edema Abdomen:  abdomen soft, nontender; bowel sounds normal; no masses or organomegaly Genital: Defer to GYN Musculoskeletal:  symmetrical muscle groups noted without atrophy or deformity Extremities:  no clubbing, cyanosis, or edema, no deformities, no skin discoloration Neuro:  gait normal; deep tendon reflexes normal and symmetric Psych: well oriented with normal range of affect and appropriate judgment/insight  Assessment and Plan  Well adult exam - Plan: CBC, Comprehensive metabolic panel, Lipid panel  Essential hypertension - Plan: lisinopril (ZESTRIL) 40 MG tablet   Well 63 y.o. female. Counseled on diet and exercise. Other orders as above. Follow up in 6 mo or prn. The patient voiced understanding and agreement to the plan.  [place], DO 06/21/19 1:33 PM

## 2019-07-11 ENCOUNTER — Other Ambulatory Visit: Payer: Self-pay | Admitting: Family Medicine

## 2019-07-11 DIAGNOSIS — Z1231 Encounter for screening mammogram for malignant neoplasm of breast: Secondary | ICD-10-CM

## 2019-07-12 ENCOUNTER — Encounter: Payer: Self-pay | Admitting: Family Medicine

## 2019-07-22 ENCOUNTER — Other Ambulatory Visit: Payer: Self-pay | Admitting: Family Medicine

## 2019-07-22 ENCOUNTER — Encounter: Payer: Self-pay | Admitting: Family Medicine

## 2019-07-22 DIAGNOSIS — E2839 Other primary ovarian failure: Secondary | ICD-10-CM

## 2019-07-26 ENCOUNTER — Encounter: Payer: Self-pay | Admitting: Family Medicine

## 2019-07-26 ENCOUNTER — Other Ambulatory Visit: Payer: Self-pay | Admitting: Family Medicine

## 2019-07-26 ENCOUNTER — Telehealth: Payer: Self-pay | Admitting: Family Medicine

## 2019-07-26 DIAGNOSIS — N644 Mastodynia: Secondary | ICD-10-CM

## 2019-07-26 DIAGNOSIS — Z1231 Encounter for screening mammogram for malignant neoplasm of breast: Secondary | ICD-10-CM

## 2019-07-26 NOTE — Telephone Encounter (Signed)
Casey Frazier / Dr Nani Ravens -- I spoke with [facility]. Order was not placed as diagnostic. They are asking for order to be changed to diagnostic and also order for ultrasound of affected breast.

## 2019-07-26 NOTE — Telephone Encounter (Signed)
Copied from [place] [id number]. Topic: General - Other >> Jul 26, 2019  9:50 AM Antonieta Iba C wrote: Reason for CRM: pt says that she spoke with [facility] and they stated that they have not received order for mammogram. Pt would like further assistance with this.   Called informed her it was put in as an order on 07/11/2019. She will call them back to let them know it has been ordered. She will call us back though if still a problem on their end.

## 2019-07-26 NOTE — Progress Notes (Signed)
3d

## 2019-08-08 ENCOUNTER — Ambulatory Visit
Admission: RE | Admit: 2019-08-08 | Discharge: 2019-08-08 | Disposition: A | Discharge status: Home / Self Care | Payer: BC Managed Care – PPO | Source: Ambulatory Visit | Attending: Family Medicine | Admitting: Family Medicine

## 2019-08-08 ENCOUNTER — Ambulatory Visit: Payer: BC Managed Care – PPO

## 2019-08-08 ENCOUNTER — Other Ambulatory Visit: Payer: Self-pay

## 2019-08-08 DIAGNOSIS — R922 Inconclusive mammogram: Secondary | ICD-10-CM | POA: Diagnosis not present

## 2019-08-08 DIAGNOSIS — N644 Mastodynia: Secondary | ICD-10-CM

## 2019-08-22 ENCOUNTER — Other Ambulatory Visit: Payer: Self-pay | Admitting: Family Medicine

## 2019-08-22 DIAGNOSIS — F341 Dysthymic disorder: Secondary | ICD-10-CM

## 2019-10-22 ENCOUNTER — Encounter: Payer: Self-pay | Admitting: Family Medicine

## 2019-10-22 ENCOUNTER — Other Ambulatory Visit: Payer: Self-pay

## 2019-10-22 ENCOUNTER — Ambulatory Visit
Admission: RE | Admit: 2019-10-22 | Discharge: 2019-10-22 | Disposition: A | Discharge status: Home / Self Care | Payer: PRIVATE HEALTH INSURANCE | Source: Ambulatory Visit | Attending: Family Medicine | Admitting: Family Medicine

## 2019-10-22 DIAGNOSIS — M8580 Other specified disorders of bone density and structure, unspecified site: Secondary | ICD-10-CM | POA: Insufficient documentation

## 2019-10-22 DIAGNOSIS — M85852 Other specified disorders of bone density and structure, left thigh: Secondary | ICD-10-CM | POA: Diagnosis not present

## 2019-10-22 DIAGNOSIS — E2839 Other primary ovarian failure: Secondary | ICD-10-CM

## 2019-10-22 DIAGNOSIS — Z780 Asymptomatic menopausal state: Secondary | ICD-10-CM | POA: Diagnosis not present

## 2019-11-24 ENCOUNTER — Other Ambulatory Visit: Payer: Self-pay | Admitting: Family Medicine

## 2019-11-24 DIAGNOSIS — F341 Dysthymic disorder: Secondary | ICD-10-CM

## 2019-11-29 ENCOUNTER — Telehealth: Payer: Self-pay | Admitting: Obstetrics and Gynecology

## 2019-11-29 ENCOUNTER — Encounter: Payer: Self-pay | Admitting: Family Medicine

## 2019-11-29 NOTE — Telephone Encounter (Signed)
Please speak with the patient and schedule her for an appointment

## 2019-11-29 NOTE — Telephone Encounter (Signed)
More Detail >>  Appointment Request  [place]: Fri November 29, 2019 9:33 AM  To: Paden  MRN: [id number] DOB: Mar 08, 1956  Pt Home: [phone]    Entered: [phone]      Message  Appointment Request From: Casey Frazier    With Provider: [name], MD [facility]]    Preferred Date Range: Any    Preferred Times: Any Time    Reason for visit: Office Visit    Comments:  A lesion that burns if urine touches....noticed about a week ago.

## 2019-11-29 NOTE — Telephone Encounter (Signed)
Spoke to pt. Pt states having a "lesion" that burns when urine touches it. Pt noticed about  1 week ago. Discussed with Dr Talbert Nan. Ok to work pt in on 12/02/2019 at 4:15 pm. Orders given to instruct pt to place Vaseline over area that is burning over the weekend and will see pt Monday. Pt agreeable and verbalized understanding.   Routing to Dr Talbert Nan for review and will close encounter.

## 2019-12-02 ENCOUNTER — Other Ambulatory Visit: Payer: Self-pay

## 2019-12-02 ENCOUNTER — Ambulatory Visit (INDEPENDENT_AMBULATORY_CARE_PROVIDER_SITE_OTHER): Payer: BC Managed Care – PPO | Admitting: Obstetrics and Gynecology

## 2019-12-02 ENCOUNTER — Encounter: Payer: Self-pay | Admitting: Obstetrics and Gynecology

## 2019-12-02 VITALS — BP 126/64 | HR 80 | Temp 97.7°F | Ht 62.0 in | Wt 133.6 lb

## 2019-12-02 DIAGNOSIS — R3129 Other microscopic hematuria: Secondary | ICD-10-CM

## 2019-12-02 DIAGNOSIS — N9089 Other specified noninflammatory disorders of vulva and perineum: Secondary | ICD-10-CM | POA: Diagnosis not present

## 2019-12-02 DIAGNOSIS — R102 Pelvic and perineal pain: Secondary | ICD-10-CM | POA: Diagnosis not present

## 2019-12-02 DIAGNOSIS — N905 Atrophy of vulva: Secondary | ICD-10-CM

## 2019-12-02 LAB — POCT URINALYSIS DIPSTICK
Bilirubin, UA: NEGATIVE
Glucose, UA: NEGATIVE
Ketones, UA: NEGATIVE
Leukocytes, UA: NEGATIVE
Nitrite, UA: NEGATIVE
Protein, UA: NEGATIVE
Urobilinogen, UA: 1 E.U./dL
pH, UA: 7 (ref 5.0–8.0)

## 2019-12-02 NOTE — Progress Notes (Signed)
GYNECOLOGY  VISIT   HPI: 64 y.o.   Single Black or African American Not Hispanic or Latino  female   [phone] with No LMP recorded. Patient has had a hysterectomy.   here for she says that she no long has the burning. It was on the left side of her vulva.  The patient called last week c/o a left vulvar lesion that burned when urine hit it. It was inside the vulva with a little white tip. She had been sexually active with lubrication prior to noticing the irritation. She has been using Vaseline over the weekend and is feeling better.   She c/o mild, intermittent SP discomfort since last week that radiates to her groin or her umbilicus. No urinary frequency, urgency or dysuria.  She takes metamucil 2-3 x a week. She is having a BM 3-4 x a week.  H/O TAH/BSO.   GYNECOLOGIC HISTORY: No LMP recorded. Patient has had a hysterectomy. Contraception:hysterectomy  Menopausal hormone therapy: estradiol         OB History    Gravida  2   Para  0   Term  0   Preterm  0   AB  2   Living  0     SAB  0   TAB  2   Ectopic  0   Multiple  0   Live Births                 Patient Active Problem List   Diagnosis Date Noted  . Osteopenia   . Numbness 03/05/2018  . Muscle inflammation 03/23/2017  . ANXIETY 03/19/2007  . Essential hypertension 03/19/2007  . HEADACHE 03/19/2007  . DEPRESSION/ANXIETY 01/22/2007    Past Medical History:  Diagnosis Date  . Allergy   . Anxiety   . Colon polyps   . Depression   . Fibroid    history of uterine fibroids  . GERD (gastroesophageal reflux disease)   . Hayfever   . Headache    migraines  . Heart murmur   . History of colon polyps   . Hypertension   . Migraines   . Osteopenia     Past Surgical History:  Procedure Laterality Date  . ABDOMINAL HYSTERECTOMY  1999   complete  . BREAST BIOPSY Left    benign  . COLONOSCOPY WITH PROPOFOL N/A 03/24/2017   Procedure: COLONOSCOPY WITH PROPOFOL;  Surgeon: Doran Stabler, MD;   Location: [facility];  Service: Gastroenterology;  Laterality: N/A;  . colonscopy     with polyps removed  . ESOPHAGOGASTRODUODENOSCOPY (EGD) WITH PROPOFOL N/A 03/24/2017   Procedure: ESOPHAGOGASTRODUODENOSCOPY (EGD) WITH PROPOFOL;  Surgeon: Doran Stabler, MD;  Location: [facility];  Service: Gastroenterology;  Laterality: N/A;  . FOOT SURGERY      Current Outpatient Medications  Medication Sig Dispense Refill  . acetaminophen (TYLENOL) 500 MG tablet Take 250 mg by mouth at bedtime as needed for moderate pain or headache.    . APPLE CIDER VINEGAR PO Take by mouth daily.    . Coenzyme Q10 (CO Q 10 PO) Take by mouth daily.    . diphenhydrAMINE (BENADRYL) 25 MG tablet Take 25 mg by mouth daily as needed for allergies.    Marland Kitchen estradiol (VIVELLE-DOT) 0.025 MG/24HR Place 1 patch onto the skin 2 (two) times a week. 8 patch 1  . lisinopril (ZESTRIL) 40 MG tablet Take 1 tablet (40 mg total) by mouth daily. 90 tablet 2  . Multiple Vitamin (MULTIVITAMIN WITH  MINERALS) TABS tablet Take 1 tablet by mouth daily.    . naphazoline (NAPHCON) 0.1 % ophthalmic solution Place 2 drops into both eyes 3 (three) times daily as needed for eye irritation.    . Omega-3 Fatty Acids (FISH OIL PO) Take by mouth daily.    Marland Kitchen venlafaxine XR (EFFEXOR-XR) 75 MG 24 hr capsule TAKE ONE CAPSULE BY MOUTH ONE TIME DAILY WITH BREAKFAST *OFFICE VISIT NEEDED* 90 capsule 0   No current facility-administered medications for this visit.     ALLERGIES: Patient has no known allergies.  Family History  Problem Relation Age of Onset  . Breast cancer Mother 52  . Lung cancer Mother   . Cancer Mother        Breast  . Hypertension Mother   . Hypertension Maternal Aunt   . Liver cancer Father        probable  . Cancer Father        Colon  . Hypertension Father   . Ulcerative colitis Sister   . Breast cancer Maternal Grandmother   . Clotting disorder Paternal Grandmother   . Clotting disorder Paternal Uncle     Social  History   Socioeconomic History  . Marital status: Single    Spouse name: Not on file  . Number of children: 0  . Years of education: Not on file  . Highest education level: Not on file  Occupational History  . Not on file  Tobacco Use  . Smoking status: Former Smoker    Quit date: 09/05/1986    Years since quitting: 33.2  . Smokeless tobacco: Never Used  . Tobacco comment: college years  Substance and Sexual Activity  . Alcohol use: No  . Drug use: No  . Sexual activity: Yes    Birth control/protection: Surgical    Comment: hysterectomy   Other Topics Concern  . Not on file  Social History Narrative  . Not on file   Social Determinants of Health   Financial Resource Strain:   . Difficulty of Paying Living Expenses:   Food Insecurity:   . Worried About Charity fundraiser in the Last Year:   . Arboriculturist in the Last Year:   Transportation Needs:   . Film/video editor (Medical):   Marland Kitchen Lack of Transportation (Non-Medical):   Physical Activity:   . Days of Exercise per Week:   . Minutes of Exercise per Session:   Stress:   . Feeling of Stress :   Social Connections:   . Frequency of Communication with Friends and Family:   . Frequency of Social Gatherings with Friends and Family:   . Attends Religious Services:   . Active Member of Clubs or Organizations:   . Attends Archivist Meetings:   Marland Kitchen Marital Status:   Intimate Partner Violence:   . Fear of Current or Ex-Partner:   . Emotionally Abused:   Marland Kitchen Physically Abused:   . Sexually Abused:     Review of Systems  All other systems reviewed and are negative.   PHYSICAL EXAMINATION:    BP 126/64   Pulse 80   Temp 97.7 F (36.5 C)   Ht 5\' 2"  (1.575 m)   Wt 133 lb 9.6 oz (60.6 kg)   SpO2 96%   BMI 24.44 kg/m     General appearance: alert, cooperative and appears stated age Abdomen: soft, non-tender; non distended, no masses,  no organomegaly Left groin: no hernia or adenopathy noted.  Pelvic: External genitalia:  no lesions, erythema or area's of irritation, mild atrophy              Urethra:  normal appearing urethra with no masses, tenderness or lesions              Bartholins and Skenes: normal                 Vagina: normal appearing vagina with normal color and discharge, no lesions              Cervix: absent              Bimanual Exam:  Uterus:  uterus absent              Adnexa: no mass, fullness, tenderness   Bladder: not tender                Chaperone was present for exam.  ASSESSMENT Vulvar irritation after intercourse, symptoms have improved with vaseline, normal exam Slight SP/left groin discomfort, normal exam Trace hematuria on urine dip    PLAN Patient reassured Continue to use lubrication with intercourse Vaseline externally as needed Will send urine for ua, c&s   An After Visit Summary was printed and given to the patient.

## 2019-12-03 LAB — URINALYSIS, MICROSCOPIC ONLY
Bacteria, UA: NONE SEEN
Casts: NONE SEEN /LPF
Epithelial Cells (non renal): NONE SEEN /HPF (ref 0–10)
RBC, Urine: NONE SEEN /HPF (ref 0–2)
WBC, UA: NONE SEEN /HPF (ref 0–5)

## 2019-12-04 LAB — URINE CULTURE: Organism ID, Bacteria: NO GROWTH

## 2019-12-16 ENCOUNTER — Other Ambulatory Visit: Payer: Self-pay

## 2019-12-20 ENCOUNTER — Ambulatory Visit: Payer: BC Managed Care – PPO | Admitting: Family Medicine

## 2019-12-24 ENCOUNTER — Ambulatory Visit: Payer: PRIVATE HEALTH INSURANCE | Admitting: Family Medicine

## 2020-01-24 ENCOUNTER — Ambulatory Visit: Payer: PRIVATE HEALTH INSURANCE | Admitting: Gastroenterology

## 2020-01-24 ENCOUNTER — Ambulatory Visit (INDEPENDENT_AMBULATORY_CARE_PROVIDER_SITE_OTHER): Payer: BC Managed Care – PPO | Admitting: Gastroenterology

## 2020-01-24 ENCOUNTER — Encounter: Payer: Self-pay | Admitting: Gastroenterology

## 2020-01-24 VITALS — BP 160/100 | HR 64 | Ht 60.5 in | Wt 131.2 lb

## 2020-01-24 DIAGNOSIS — R140 Abdominal distension (gaseous): Secondary | ICD-10-CM

## 2020-01-24 DIAGNOSIS — R194 Change in bowel habit: Secondary | ICD-10-CM

## 2020-01-24 NOTE — Progress Notes (Signed)
Casey Frazier Progress Note  Chief Complaint: Change in bowel habits and bloating  Subjective  History: Last seen July 2018 having epigastric pain, dysphagia and left lower quadrant pain with diarrhea.  EGD normal.  Colonoscopy normal except diverticulosis.  Biopsies negative for microscopic colitis.  Repeat colonoscopy in 5 years recommended due to family history colon cancer. I felt symptoms were at least in part related to some social stressors and recent discontinuation of venlafaxine.  She lost her job and insurance shortly after that, making it very difficult for her to access medical care.  [name] want to see me for some changes in bowel function that seem to have occurred over the last few months.  As before, she has some belching, bloating and "gurgling".  She is bothered that her intestines often seem to make a lot of noise.  She has a bowel movement about every other day, it got better when she took some fiber but then she stopped because of the bloating.  Bowel habits now seem to have improved somewhat on their own.  She denies rectal bleeding loss of appetite or weight loss.  She was purposely losing weight by controlling her portions, and denies vomiting or dysphagia or heartburn. She went to gynecology recently for some vulvar irritation, had a thorough bimanual exam, no imaging.  Had a previous TAH/BSO  ROS: Cardiovascular:  no chest pain Respiratory: no dyspnea  The patient's Past Medical, Family and Social History were reviewed and are on file in the EMR.  Objective:  Med list reviewed  Current Outpatient Medications:  .  acetaminophen (TYLENOL) 500 MG tablet, Take 250 mg by mouth at bedtime as needed for moderate pain or headache., Disp: , Rfl:  .  Calcium Carbonate (CALCIUM 600 PO), Take 1-2 tablets by mouth daily., Disp: , Rfl:  .  Cyanocobalamin (VITAMIN B-12 PO), Take 1 tablet by mouth daily., Disp: , Rfl:  .  diphenhydrAMINE (BENADRYL) 25 MG tablet, Take  25 mg by mouth daily as needed for allergies., Disp: , Rfl:  .  lisinopril (ZESTRIL) 40 MG tablet, Take 1 tablet (40 mg total) by mouth daily., Disp: 90 tablet, Rfl: 2 .  Multiple Vitamin (MULTIVITAMIN WITH MINERALS) TABS tablet, Take 1 tablet by mouth daily., Disp: , Rfl:  .  naphazoline (NAPHCON) 0.1 % ophthalmic solution, Place 2 drops into both eyes 3 (three) times daily as needed for eye irritation., Disp: , Rfl:  .  Omega-3 Fatty Acids (FISH OIL PO), Take by mouth daily., Disp: , Rfl:  .  Psyllium (METAMUCIL PO), Take 1 Dose by mouth as needed., Disp: , Rfl:  .  venlafaxine XR (EFFEXOR-XR) 75 MG 24 hr capsule, TAKE ONE CAPSULE BY MOUTH ONE TIME DAILY WITH BREAKFAST *OFFICE VISIT NEEDED*, Disp: 90 capsule, Rfl: 0 .  APPLE CIDER VINEGAR PO, Take by mouth daily., Disp: , Rfl:  .  Coenzyme Q10 (CO Q 10 PO), Take by mouth daily., Disp: , Rfl:  .  estradiol (VIVELLE-DOT) 0.025 MG/24HR, Place 1 patch onto the skin 2 (two) times a week. (Patient not taking: Reported on 01/24/2020), Disp: 8 patch, Rfl: 1   Vital signs in last 24 hrs: Vitals:   01/24/20 1351  BP: (!) 160/100  Pulse: 64    Physical Exam  Well-appearing, pleasant and conversational.  HEENT: sclera anicteric, oral mucosa moist without lesions  Neck: supple, no thyromegaly, JVD or lymphadenopathy  Cardiac: RRR without murmurs, S1S2 heard, no peripheral edema  Pulm: clear to auscultation  bilaterally, normal RR and effort noted  Abdomen: soft, no tenderness, with active bowel sounds. No guarding or palpable hepatosplenomegaly.  Skin; warm and dry, no jaundice or rash  Labs:   ___________________________________________ Radiologic studies:   ____________________________________________ Other:   _____________________________________________ Assessment & Plan  Assessment: Encounter Diagnoses  Name Primary?  . Change in bowel habits Yes  . Abdominal bloating    Symptoms that sound benign.  We discussed, changes  in digestive function.  She was concerned about the possibility of cancer, but I do not think that is the case.  No current plans for endoscopic testing or imaging.  Reassured. Next routine colonoscopy July [id number], but I would certainly want to see her sooner as the need arises.  20 minutes were spent on this encounter (including chart review, history/exam, counseling/coordination of care, and documentation)  Nelida Meuse III

## 2020-01-24 NOTE — Patient Instructions (Signed)
If you are age 64 or older, your body mass index should be between 23-30. Your Body mass index is 25.21 kg/m. If this is out of the aforementioned range listed, please consider follow up with your Primary Care Provider.  If you are age 59 or younger, your body mass index should be between 19-25. Your Body mass index is 25.21 kg/m. If this is out of the aformentioned range listed, please consider follow up with your Primary Care Provider.    It was a pleasure to see you today!  Dr. Loletha Carrow

## 2020-02-22 ENCOUNTER — Other Ambulatory Visit: Payer: Self-pay | Admitting: Family Medicine

## 2020-02-22 DIAGNOSIS — F341 Dysthymic disorder: Secondary | ICD-10-CM

## 2020-05-08 ENCOUNTER — Ambulatory Visit (INDEPENDENT_AMBULATORY_CARE_PROVIDER_SITE_OTHER): Payer: BC Managed Care – PPO | Admitting: Family

## 2020-05-08 ENCOUNTER — Other Ambulatory Visit: Payer: Self-pay

## 2020-05-08 ENCOUNTER — Encounter: Payer: Self-pay | Admitting: Family

## 2020-05-08 VITALS — BP 184/103 | HR 61 | Temp 98.0°F | Resp 16 | Ht 62.0 in | Wt 130.0 lb

## 2020-05-08 DIAGNOSIS — I10 Essential (primary) hypertension: Secondary | ICD-10-CM

## 2020-05-08 DIAGNOSIS — M5412 Radiculopathy, cervical region: Secondary | ICD-10-CM

## 2020-05-08 MED ORDER — MELOXICAM 7.5 MG PO TABS
7.5000 mg | ORAL_TABLET | Freq: Every day | ORAL | 0 refills | Status: DC
Start: 2020-05-08 — End: 2020-06-23

## 2020-05-08 MED ORDER — METHOCARBAMOL 500 MG PO TABS: 500.0000 mg | ORAL_TABLET | Freq: Three times a day (TID) | ORAL | 0 refills | Status: DC | PRN

## 2020-05-08 MED ORDER — HYDROCHLOROTHIAZIDE 25 MG PO TABS
25.0000 mg | ORAL_TABLET | Freq: Every day | ORAL | 3 refills | Status: DC
Start: 2020-05-08 — End: 2020-06-23

## 2020-05-08 NOTE — Patient Instructions (Signed)
Please begin hctz once daily for blood pressure. Check pressure once daily and bring readings to your follow up in 1 week. Begin meloxicam (anti-inflammatory) once daily for neck pain.  You may use robaxin (muscle relaxer) 3 times daily as needed for muscle spasm.

## 2020-05-08 NOTE — Progress Notes (Signed)
Subjective:    Patient ID: Casey Frazier, female    DOB: Dec 12, 1955, 64 y.o.   MRN: [id number]  HPI  Patient is a 64 yr old female who presents today with chief complaint of neck pain. Pain began 3 days ago. Reports "Muscle spasm" in the back of her neck on the left." She has been using tylenol arthritis which helps only briefly.  Reports that pain will radiate up the back of her head on the left and also down into the left shoulder.   HTN- not checking home bp.  Reports good compliance with lisinopril 40 mg.   BP Readings from Last 3 Encounters:  05/08/20 (!) 184/103  01/24/20 (!) 160/100  12/02/19 126/64     Review of Systems     Past Medical History:  Diagnosis Date  . Allergy   . Anxiety   . Colon polyps   . Depression   . Fibroid    history of uterine fibroids  . GERD (gastroesophageal reflux disease)   . Hayfever   . Headache    migraines  . Heart murmur   . History of colon polyps   . Hypertension   . Migraines   . Osteopenia      Social History   Socioeconomic History  . Marital status: Single    Spouse name: Not on file  . Number of children: 0  . Years of education: Not on file  . Highest education level: Not on file  Occupational History  . Not on file  Tobacco Use  . Smoking status: Former Smoker    Quit date: 09/05/1986    Years since quitting: 33.6  . Smokeless tobacco: Never Used  . Tobacco comment: college years  Vaping Use  . Vaping Use: Never used  Substance and Sexual Activity  . Alcohol use: No  . Drug use: No  . Sexual activity: Yes    Birth control/protection: Surgical    Comment: hysterectomy   Other Topics Concern  . Not on file  Social History Narrative  . Not on file   Social Determinants of Health   Financial Resource Strain:   . Difficulty of Paying Living Expenses: Not on file  Food Insecurity:   . Worried About Charity fundraiser in the Last Year: Not on file  . Ran Out of Food in the Last Year: Not on file    Transportation Needs:   . Lack of Transportation (Medical): Not on file  . Lack of Transportation (Non-Medical): Not on file  Physical Activity:   . Days of Exercise per Week: Not on file  . Minutes of Exercise per Session: Not on file  Stress:   . Feeling of Stress : Not on file  Social Connections:   . Frequency of Communication with Friends and Family: Not on file  . Frequency of Social Gatherings with Friends and Family: Not on file  . Attends Religious Services: Not on file  . Active Member of Clubs or Organizations: Not on file  . Attends Archivist Meetings: Not on file  . Marital Status: Not on file  Intimate Partner Violence:   . Fear of Current or Ex-Partner: Not on file  . Emotionally Abused: Not on file  . Physically Abused: Not on file  . Sexually Abused: Not on file    Past Surgical History:  Procedure Laterality Date  . ABDOMINAL HYSTERECTOMY  1999   complete  . BREAST BIOPSY Left    benign  .  COLONOSCOPY WITH PROPOFOL N/A 03/24/2017   Procedure: COLONOSCOPY WITH PROPOFOL;  Surgeon: Doran Stabler, MD;  Location: [facility];  Service: Gastroenterology;  Laterality: N/A;  . colonscopy     with polyps removed  . ESOPHAGOGASTRODUODENOSCOPY (EGD) WITH PROPOFOL N/A 03/24/2017   Procedure: ESOPHAGOGASTRODUODENOSCOPY (EGD) WITH PROPOFOL;  Surgeon: Doran Stabler, MD;  Location: [facility];  Service: Gastroenterology;  Laterality: N/A;  . FOOT SURGERY      Family History  Problem Relation Age of Onset  . Breast cancer Mother 67  . Lung cancer Mother   . Hypertension Mother   . Hypertension Maternal Aunt   . Liver cancer Father        probable  . Hypertension Father   . Colon cancer Father   . Ulcerative colitis Sister   . Breast cancer Maternal Grandmother   . Clotting disorder Paternal Grandmother   . Clotting disorder Paternal Uncle     No Known Allergies  Current Outpatient Medications on File Prior to Visit  Medication Sig  Dispense Refill  . acetaminophen (TYLENOL) 500 MG tablet Take 250 mg by mouth at bedtime as needed for moderate pain or headache.    . APPLE CIDER VINEGAR PO Take by mouth daily.    . Calcium Carbonate (CALCIUM 600 PO) Take 1-2 tablets by mouth daily.    . Coenzyme Q10 (CO Q 10 PO) Take by mouth daily.    . Cyanocobalamin (VITAMIN B-12 PO) Take 1 tablet by mouth daily.    . diphenhydrAMINE (BENADRYL) 25 MG tablet Take 25 mg by mouth daily as needed for allergies.    Marland Kitchen estradiol (VIVELLE-DOT) 0.025 MG/24HR Place 1 patch onto the skin 2 (two) times a week. 8 patch 1  . lisinopril (ZESTRIL) 40 MG tablet Take 1 tablet (40 mg total) by mouth daily. 90 tablet 2  . Multiple Vitamin (MULTIVITAMIN WITH MINERALS) TABS tablet Take 1 tablet by mouth daily.    . naphazoline (NAPHCON) 0.1 % ophthalmic solution Place 2 drops into both eyes 3 (three) times daily as needed for eye irritation.    . Omega-3 Fatty Acids (FISH OIL PO) Take by mouth daily.    . Psyllium (METAMUCIL PO) Take 1 Dose by mouth as needed.    . venlafaxine XR (EFFEXOR-XR) 75 MG 24 hr capsule TAKE ONE CAPSULE BY MOUTH ONE TIME DAILY with breakfast *office visit needed for future refills* 90 capsule 0   No current facility-administered medications on file prior to visit.    BP (!) 184/103 (BP Location: Right Arm, Patient Position: Sitting, Cuff Size: Small)   Pulse 61   Temp 98 F (36.7 C) (Oral)   Resp 16   Ht 5\' 2"  (1.575 m)   Wt 130 lb (59 kg)   SpO2 100%   BMI 23.78 kg/m   Objective:   Physical Exam Constitutional:      Appearance: She is well-developed.  Neck:     Thyroid: No thyromegaly.  Cardiovascular:     Rate and Rhythm: Normal rate and regular rhythm.     Heart sounds: Normal heart sounds. No murmur heard.   Pulmonary:     Effort: Pulmonary effort is normal. No respiratory distress.     Breath sounds: Normal breath sounds. No wheezing.  Musculoskeletal:     Cervical back: Neck supple.  Skin:    General: Skin  is warm and dry.  Neurological:     Mental Status: She is alert and oriented to person, place, and  time.     Comments: Bilateral UE strength is 5/5   Psychiatric:        Behavior: Behavior normal.        Thought Content: Thought content normal.        Judgment: Judgment normal.           Assessment & Plan:  HTN-uncontrolled. Will add HCTZ 25 mg once daily. Continue lisinopril 40mg .  Cervical radiculopathy- new. Will rx with meloxicam and prn robaxin.   This visit occurred during the SARS-CoV-2 public health emergency.  Safety protocols were in place, including screening questions prior to the visit, additional usage of staff PPE, and extensive cleaning of exam room while observing appropriate contact time as indicated for disinfecting solutions.

## 2020-05-18 ENCOUNTER — Ambulatory Visit: Payer: BC Managed Care – PPO | Admitting: Family Medicine

## 2020-05-22 ENCOUNTER — Other Ambulatory Visit: Payer: Self-pay

## 2020-05-22 ENCOUNTER — Encounter: Payer: Self-pay | Admitting: Family Medicine

## 2020-05-22 ENCOUNTER — Ambulatory Visit (INDEPENDENT_AMBULATORY_CARE_PROVIDER_SITE_OTHER): Payer: BC Managed Care – PPO | Admitting: Family Medicine

## 2020-05-22 VITALS — BP 139/98 | HR 60 | Temp 98.8°F | Ht 62.0 in | Wt 127.0 lb

## 2020-05-22 DIAGNOSIS — I10 Essential (primary) hypertension: Secondary | ICD-10-CM

## 2020-05-22 MED ORDER — AMLODIPINE BESYLATE 5 MG PO TABS: 5.0000 mg | ORAL_TABLET | Freq: Every day | ORAL | 3 refills | Status: DC

## 2020-05-22 NOTE — Progress Notes (Signed)
Chief Complaint  Patient presents with  . Follow-up    blood pressure  . Neck Pain    Subjective REDITH DRACH is a 64 y.o. female who presents for hypertension follow up. She does not monitor home blood pressures. She is compliant with medications- lisinopril 40 mg/d, HCTZ 25 mg/d just started. Patient has these side effects of medication: none She is usually adhering to a healthy diet overall. Current exercise: walking, active around house   Past Medical History:  Diagnosis Date  . Allergy   . Anxiety   . Colon polyps   . Depression   . Fibroid    history of uterine fibroids  . GERD (gastroesophageal reflux disease)   . Hayfever   . Headache    migraines  . Heart murmur   . History of colon polyps   . Hypertension   . Migraines   . Osteopenia     Exam BP (!) 139/98 (BP Location: Left Arm, Patient Position: Sitting, Cuff Size: Normal)   Pulse 60   Temp 98.8 F (37.1 C) (Oral)   Ht 5\' 2"  (1.575 m)   Wt 127 lb (57.6 kg)   SpO2 99%   BMI 23.23 kg/m  General:  well developed, well nourished, in no apparent distress  Heart: RRR, no bruits, no LE edema Lungs: clear to auscultation, no accessory muscle use Psych: well oriented with normal range of affect and appropriate judgment/insight  Essential hypertension - Plan: amLODipine (NORVASC) 5 MG tablet  Uncontrolled.  Add Norvasc.  Continue lisinopril and hydrochlorothiazide.  Counseled on diet and exercise. F/u in 1 month for physical where we will recheck her blood pressure as well.  The patient voiced understanding and agreement to the plan.  [place], DO 05/22/20  1:49 PM

## 2020-05-22 NOTE — Patient Instructions (Signed)
Start checking your blood pressures several times per week.   Keep the diet clean and stay active.  Let us know if you need anything.

## 2020-05-25 ENCOUNTER — Other Ambulatory Visit: Payer: Self-pay | Admitting: Family Medicine

## 2020-05-25 DIAGNOSIS — F341 Dysthymic disorder: Secondary | ICD-10-CM

## 2020-06-23 ENCOUNTER — Other Ambulatory Visit: Payer: Self-pay

## 2020-06-23 ENCOUNTER — Encounter: Payer: Self-pay | Admitting: Family Medicine

## 2020-06-23 ENCOUNTER — Ambulatory Visit (INDEPENDENT_AMBULATORY_CARE_PROVIDER_SITE_OTHER): Payer: BC Managed Care – PPO | Admitting: Family Medicine

## 2020-06-23 VITALS — BP 124/80 | HR 62 | Temp 98.2°F | Ht 62.0 in | Wt 121.2 lb

## 2020-06-23 DIAGNOSIS — I10 Essential (primary) hypertension: Secondary | ICD-10-CM

## 2020-06-23 DIAGNOSIS — Z0000 Encounter for general adult medical examination without abnormal findings: Secondary | ICD-10-CM

## 2020-06-23 DIAGNOSIS — E785 Hyperlipidemia, unspecified: Secondary | ICD-10-CM | POA: Diagnosis not present

## 2020-06-23 MED ORDER — AMLODIPINE BESYLATE 5 MG PO TABS: 5.0000 mg | ORAL_TABLET | Freq: Every day | ORAL | 2 refills | Status: DC

## 2020-06-23 MED ORDER — LISINOPRIL 40 MG PO TABS: 40.0000 mg | ORAL_TABLET | Freq: Every day | ORAL | 2 refills | Status: DC

## 2020-06-23 MED ORDER — HYDROCHLOROTHIAZIDE 25 MG PO TABS: 25.0000 mg | ORAL_TABLET | Freq: Every day | ORAL | 2 refills | Status: DC

## 2020-06-23 NOTE — Patient Instructions (Signed)
The new Shingrix vaccine (for shingles) is a 2 shot series. It can make people feel low energy, achy and almost like they have the flu for 48 hours after injection. Please plan accordingly when deciding on when to get this shot. Call our office for a nurse visit appointment to get this. The second shot of the series is less severe regarding the side effects, but it still lasts 48 hours.   Give [place] 2-3 business days to get the results of your labs back.   Keep the diet clean and stay active.  Let us know if you need anything.

## 2020-06-23 NOTE — Progress Notes (Signed)
Chief Complaint  Patient presents with  . Annual Exam     Well Woman Casey Frazier is here for a complete physical.   Her last physical was >1 year ago.  Current diet: in general, a "healthy" diet. Current exercise: walking, dancing. Weight is down intentionally and she denies fatigue out of ordinary. Seatbelt? Yes  Health Maintenance Mammogram- Yes Colon cancer screening-Yes Shingrix- No Tetanus- Yes Hep C screening- Yes HIV screening- Yes  Past Medical History:  Diagnosis Date  . Allergy   . Anxiety   . Colon polyps   . Depression   . Fibroid    history of uterine fibroids  . GERD (gastroesophageal reflux disease)   . Hayfever   . Headache    migraines  . Heart murmur   . History of colon polyps   . Hypertension   . Migraines   . Osteopenia      Past Surgical History:  Procedure Laterality Date  . ABDOMINAL HYSTERECTOMY  1999   complete  . BREAST BIOPSY Left    benign  . COLONOSCOPY WITH PROPOFOL N/A 03/24/2017   Procedure: COLONOSCOPY WITH PROPOFOL;  Surgeon: Doran Stabler, MD;  Location: [facility];  Service: Gastroenterology;  Laterality: N/A;  . colonscopy     with polyps removed  . ESOPHAGOGASTRODUODENOSCOPY (EGD) WITH PROPOFOL N/A 03/24/2017   Procedure: ESOPHAGOGASTRODUODENOSCOPY (EGD) WITH PROPOFOL;  Surgeon: Doran Stabler, MD;  Location: [facility];  Service: Gastroenterology;  Laterality: N/A;  . FOOT SURGERY      Medications  Current Outpatient Medications on File Prior to Visit  Medication Sig Dispense Refill  . acetaminophen (TYLENOL) 500 MG tablet Take 250 mg by mouth at bedtime as needed for moderate pain or headache.    Marland Kitchen amLODipine (NORVASC) 5 MG tablet Take 1 tablet (5 mg total) by mouth daily. 30 tablet 3  . APPLE CIDER VINEGAR PO Take by mouth daily.    . Calcium Carbonate (CALCIUM 600 PO) Take 1-2 tablets by mouth daily.    . Coenzyme Q10 (CO Q 10 PO) Take by mouth daily.    . Cyanocobalamin (VITAMIN B-12 PO) Take 1  tablet by mouth daily.    . diphenhydrAMINE (BENADRYL) 25 MG tablet Take 25 mg by mouth daily as needed for allergies.    Marland Kitchen estradiol (VIVELLE-DOT) 0.025 MG/24HR Place 1 patch onto the skin 2 (two) times a week. 8 patch 1  . hydrochlorothiazide (HYDRODIURIL) 25 MG tablet Take 1 tablet (25 mg total) by mouth daily. 30 tablet 3  . lisinopril (ZESTRIL) 40 MG tablet Take 1 tablet (40 mg total) by mouth daily. 90 tablet 2  . meloxicam (MOBIC) 7.5 MG tablet Take 1 tablet (7.5 mg total) by mouth daily. 14 tablet 0  . methocarbamol (ROBAXIN) 500 MG tablet Take 1 tablet (500 mg total) by mouth every 8 (eight) hours as needed for muscle spasms. 20 tablet 0  . Multiple Vitamin (MULTIVITAMIN WITH MINERALS) TABS tablet Take 1 tablet by mouth daily.    . naphazoline (NAPHCON) 0.1 % ophthalmic solution Place 2 drops into both eyes 3 (three) times daily as needed for eye irritation.    . Omega-3 Fatty Acids (FISH OIL PO) Take by mouth daily.    . Psyllium (METAMUCIL PO) Take 1 Dose by mouth as needed.    . venlafaxine XR (EFFEXOR-XR) 75 MG 24 hr capsule TAKE ONE CAPSULE BY MOUTH ONE TIME DAILY with breakfast *office visit needed* 90 capsule 0   Allergies No  Known Allergies  Review of Systems: Constitutional:  no unexpected weight changes Eye:  no recent significant change in vision Ear/Nose/Mouth/Throat:  Ears:  no recent change in hearing Nose/Mouth/Throat:  no complaints of nasal congestion, no sore throat Cardiovascular: no chest pain Respiratory:  no shortness of breath Gastrointestinal:  no abdominal pain, no change in bowel habits GU:  Female: negative for dysuria or pelvic pain Musculoskeletal/Extremities:  no pain of the joints Integumentary (Skin/Breast):  no abnormal skin lesions reported Neurologic:  no headaches Endocrine:  denies fatigue  Exam BP 124/80 (BP Location: Left Arm, Patient Position: Sitting, Cuff Size: Normal)   Pulse 62   Temp 98.2 F (36.8 C) (Oral)   Ht 5\' 2"  (1.575 m)    Wt 121 lb 4 oz (55 kg)   SpO2 95%   BMI 22.18 kg/m  General:  well developed, well nourished, in no apparent distress Skin:  no significant moles, warts, or growths Head:  no masses, lesions, or tenderness Eyes:  pupils equal and round, sclera anicteric without injection Ears:  canals without lesions, TMs shiny without retraction, no obvious effusion, no erythema Nose:  nares patent, septum midline, mucosa normal, and no drainage or sinus tenderness Throat/Pharynx:  lips and gingiva without lesion; tongue and uvula midline; non-inflamed pharynx; no exudates or postnasal drainage Neck: neck supple without adenopathy, thyromegaly, or masses Lungs:  clear to auscultation, breath sounds equal bilaterally, no respiratory distress Cardio:  regular rate and rhythm, no LE edema Abdomen:  abdomen soft, nontender; bowel sounds normal; no masses or organomegaly Genital: Defer to GYN Musculoskeletal:  symmetrical muscle groups noted without atrophy or deformity Extremities:  no clubbing, cyanosis, or edema, no deformities, no skin discoloration Neuro:  gait normal; deep tendon reflexes normal and symmetric Psych: well oriented with normal range of affect and appropriate judgment/insight  Assessment and Plan  Well adult exam - Plan: Comprehensive metabolic panel, Lipid panel  Essential hypertension - Plan: lisinopril (ZESTRIL) 40 MG tablet, amLODipine (NORVASC) 5 MG tablet   Well 64 y.o. female. Counseled on diet and exercise. Other orders as above. BP looks good today.  Shingrix info given. She will get flu shot at work.  Follow up in 6 mo or prn. The patient voiced understanding and agreement to the plan.  [place], DO 06/23/20 12:58 PM

## 2020-06-24 ENCOUNTER — Other Ambulatory Visit: Payer: Self-pay | Admitting: Family Medicine

## 2020-06-24 DIAGNOSIS — E785 Hyperlipidemia, unspecified: Secondary | ICD-10-CM

## 2020-06-24 LAB — COMPREHENSIVE METABOLIC PANEL
AG Ratio: 1.6 (calc) (ref 1.0–2.5)
ALT: 16 U/L (ref 6–29)
AST: 17 U/L (ref 10–35)
Albumin: 4.4 g/dL (ref 3.6–5.1)
Alkaline phosphatase (APISO): 104 U/L (ref 37–153)
BUN/Creatinine Ratio: 12 (calc) (ref 6–22)
BUN: 12 mg/dL (ref 7–25)
CO2: 33 mmol/L — ABNORMAL HIGH (ref 20–32)
Calcium: 10.3 mg/dL (ref 8.6–10.4)
Chloride: 98 mmol/L (ref 98–110)
Creat: 1.01 mg/dL — ABNORMAL HIGH (ref 0.50–0.99)
Globulin: 2.8 g/dL (calc) (ref 1.9–3.7)
Glucose, Bld: 82 mg/dL (ref 65–99)
Potassium: 4.5 mmol/L (ref 3.5–5.3)
Sodium: 140 mmol/L (ref 135–146)
Total Bilirubin: 0.5 mg/dL (ref 0.2–1.2)
Total Protein: 7.2 g/dL (ref 6.1–8.1)

## 2020-06-24 LAB — LIPID PANEL
Cholesterol: 233 mg/dL — ABNORMAL HIGH (ref <200)
HDL: 63 mg/dL (ref >=50)
LDL Cholesterol (Calc): 144 mg/dL (calc) — ABNORMAL HIGH
Non-HDL Cholesterol (Calc): 170 mg/dL (calc) — ABNORMAL HIGH (ref <130)
Total CHOL/HDL Ratio: 3.7 (calc) (ref <5.0)
Triglycerides: 133 mg/dL (ref <150)

## 2020-07-29 NOTE — Addendum Note (Signed)
Addended by: Kelle Darting A on: 07/29/2020 03:37 PM   Modules accepted: Orders

## 2020-08-04 ENCOUNTER — Other Ambulatory Visit (INDEPENDENT_AMBULATORY_CARE_PROVIDER_SITE_OTHER): Payer: BC Managed Care – PPO

## 2020-08-04 ENCOUNTER — Other Ambulatory Visit: Payer: Self-pay

## 2020-08-04 DIAGNOSIS — E785 Hyperlipidemia, unspecified: Secondary | ICD-10-CM

## 2020-08-04 LAB — LIPID PANEL
Cholesterol: 183 mg/dL (ref 0–200)
HDL: 64 mg/dL (ref >39.00)
LDL Cholesterol: 100 mg/dL — ABNORMAL HIGH (ref 0–99)
NonHDL: 119.35
Total CHOL/HDL Ratio: 3
Triglycerides: 96 mg/dL (ref 0.0–149.0)
VLDL: 19.2 mg/dL (ref 0.0–40.0)

## 2020-08-17 ENCOUNTER — Other Ambulatory Visit: Payer: Self-pay | Admitting: Family Medicine

## 2020-08-17 DIAGNOSIS — Z1231 Encounter for screening mammogram for malignant neoplasm of breast: Secondary | ICD-10-CM

## 2020-08-31 ENCOUNTER — Other Ambulatory Visit: Payer: Self-pay | Admitting: Family Medicine

## 2020-08-31 DIAGNOSIS — F341 Dysthymic disorder: Secondary | ICD-10-CM

## 2020-09-02 DIAGNOSIS — H43812 Vitreous degeneration, left eye: Secondary | ICD-10-CM | POA: Diagnosis not present

## 2020-09-16 DIAGNOSIS — Z20822 Contact with and (suspected) exposure to covid-19: Secondary | ICD-10-CM | POA: Diagnosis not present

## 2020-09-25 ENCOUNTER — Ambulatory Visit: Payer: BC Managed Care – PPO

## 2020-11-05 DIAGNOSIS — I10 Essential (primary) hypertension: Secondary | ICD-10-CM | POA: Diagnosis not present

## 2020-11-09 ENCOUNTER — Ambulatory Visit
Admission: RE | Admit: 2020-11-09 | Discharge: 2020-11-09 | Disposition: A | Discharge status: Home / Self Care | Payer: BC Managed Care – PPO | Source: Ambulatory Visit | Attending: Family Medicine | Admitting: Family Medicine

## 2020-11-09 ENCOUNTER — Other Ambulatory Visit: Payer: Self-pay

## 2020-11-09 DIAGNOSIS — Z1231 Encounter for screening mammogram for malignant neoplasm of breast: Secondary | ICD-10-CM

## 2020-11-17 NOTE — Telephone Encounter (Signed)
Paperwork received and put into [employer] folder up front

## 2020-11-18 ENCOUNTER — Encounter: Payer: Self-pay | Admitting: Family Medicine

## 2020-11-18 ENCOUNTER — Telehealth (INDEPENDENT_AMBULATORY_CARE_PROVIDER_SITE_OTHER): Payer: BC Managed Care – PPO | Admitting: Family Medicine

## 2020-11-18 ENCOUNTER — Other Ambulatory Visit: Payer: Self-pay

## 2020-11-18 DIAGNOSIS — F411 Generalized anxiety disorder: Secondary | ICD-10-CM

## 2020-11-18 DIAGNOSIS — F322 Major depressive disorder, single episode, severe without psychotic features: Secondary | ICD-10-CM

## 2020-11-18 NOTE — Progress Notes (Signed)
Chief Complaint  Patient presents with  . FMLA paperwork    Pt states wanting to FMLA due to having lack of attention and would like some time to herself.     Subjective Casey Frazier presents for f/u anxiety/depression. Due to COVID-19 pandemic, we are interacting via web portal for an electronic face-to-face visit. I verified patient's ID using 2 identifiers. Patient agreed to proceed with visit via this method. Patient is at home, I am at office. Patient and I are present for visit.   Pt is currently being treated with Effexor XR 75 mg/d.  Reports OK since treatment. She is having depressive symptoms and anxiety s/s's.  No thoughts of harming self or others. No self-medication with alcohol, prescription drugs or illicit drugs. Pt is not following with a counselor/psychologist.  Past Medical History:  Diagnosis Date  . Allergy   . Anxiety   . Colon polyps   . Depression   . Fibroid    history of uterine fibroids  . GERD (gastroesophageal reflux disease)   . Hayfever   . Headache    migraines  . Heart murmur   . History of colon polyps   . Hypertension   . Migraines   . Osteopenia    Exam No conversational dyspnea Age appropriate judgment and insight Nml affect and mood  Assessment and Plan  Major depressive disorder, severe (HCC)  GAD (generalized anxiety disorder)   Status: chronic, uncontrolled. Requesting from 3/21/4/8. I am fine with that. Cont Effexor XR 75 mg/d, will provide counseling info. Counseled on exercise.  F/u in as originally scheduled.  The patient voiced understanding and agreement to the plan.  [place], DO 11/18/20 4:21 PM

## 2020-11-25 ENCOUNTER — Telehealth: Payer: Self-pay | Admitting: Family Medicine

## 2020-11-25 NOTE — Telephone Encounter (Signed)
Papers faxed into office  Placed in [employer] foler

## 2020-11-25 NOTE — Telephone Encounter (Signed)
Is in PCP's folder  

## 2020-11-27 NOTE — Telephone Encounter (Signed)
Called left message to call back 

## 2020-11-27 NOTE — Telephone Encounter (Signed)
Form completed Has been faxed to [phone] Claim # [id number] Patient informed

## 2020-11-28 ENCOUNTER — Other Ambulatory Visit: Payer: Self-pay | Admitting: Family Medicine

## 2020-11-28 ENCOUNTER — Other Ambulatory Visit: Payer: Self-pay

## 2020-11-28 DIAGNOSIS — F341 Dysthymic disorder: Secondary | ICD-10-CM

## 2020-11-30 ENCOUNTER — Telehealth: Payer: Self-pay | Admitting: Family Medicine

## 2020-11-30 ENCOUNTER — Other Ambulatory Visit: Payer: Self-pay

## 2020-11-30 DIAGNOSIS — F341 Dysthymic disorder: Secondary | ICD-10-CM

## 2020-11-30 MED ORDER — VENLAFAXINE HCL ER 75 MG PO CP24: 75.0000 mg | ORAL_CAPSULE | Freq: Every day | ORAL | 0 refills | Status: DC

## 2020-11-30 NOTE — Telephone Encounter (Signed)
Refill has been done.

## 2020-11-30 NOTE — Telephone Encounter (Signed)
Medication: venlafaxine XR (EFFEXOR-XR) 75 MG 24 hr capsule [[id number]]      Has the patient contacted their pharmacy? no (If no, request that the patient contact the pharmacy for the refill.) (If yes, when and what did the pharmacy advise?)    Preferred Pharmacy (with phone number or street name):  [facility] [place]  [address] [place] [address]  Phone:  [phone] Fax:  [phone]      Agent: Please be advised that RX refills may take up to 3 business days. We ask that you follow-up with your pharmacy.

## 2020-12-05 DIAGNOSIS — I10 Essential (primary) hypertension: Secondary | ICD-10-CM | POA: Diagnosis not present

## 2020-12-08 ENCOUNTER — Telehealth: Payer: Self-pay | Admitting: Family Medicine

## 2020-12-08 NOTE — Telephone Encounter (Signed)
Patient is scheduled tomorrow 12/09/20. Her dogs collar scratched her leg leaving a 1/2 inch scratch.   She said it did bleed some/she put a band aid and neosporin on it. The did walkin to [facility], but due to cost could not stay.  A RN did look at it there and said it did not need stitches. This happened on Sunday evening. Is now a little bruised. This patient scheduled her appt as an open wound and needed to call to make sure it was not something she would need to go to the ER for. Scheduled tomorrow 12/09/20

## 2020-12-09 ENCOUNTER — Other Ambulatory Visit: Payer: Self-pay

## 2020-12-09 ENCOUNTER — Encounter: Payer: Self-pay | Admitting: Family Medicine

## 2020-12-09 ENCOUNTER — Ambulatory Visit (INDEPENDENT_AMBULATORY_CARE_PROVIDER_SITE_OTHER): Payer: BC Managed Care – PPO | Admitting: Family Medicine

## 2020-12-09 VITALS — BP 140/84 | HR 84 | Temp 98.2°F | Ht 62.0 in | Wt 124.2 lb

## 2020-12-09 DIAGNOSIS — Z23 Encounter for immunization: Secondary | ICD-10-CM | POA: Diagnosis not present

## 2020-12-09 DIAGNOSIS — F322 Major depressive disorder, single episode, severe without psychotic features: Secondary | ICD-10-CM | POA: Diagnosis not present

## 2020-12-09 DIAGNOSIS — S81802A Unspecified open wound, left lower leg, initial encounter: Secondary | ICD-10-CM | POA: Diagnosis not present

## 2020-12-09 NOTE — Progress Notes (Signed)
Chief Complaint  Patient presents with  . Leg Injury    Cut leg on pronged dog collar Sunday evening 12/06/20.    Casey Frazier is a 65 y.o. female here for a skin complaint.  Duration: 3 days Location: Left lower extremity laterally Pruritic? No Painful? Yes Drainage? No A pronged dog collar scraped her Other associated symptoms: no fevers Therapies tried thus far: TAO Last tetanus shot was in 2019.  Past Medical History:  Diagnosis Date  . Allergy   . Anxiety   . Colon polyps   . Depression   . Fibroid    history of uterine fibroids  . GERD (gastroesophageal reflux disease)   . Hayfever   . Headache    migraines  . Heart murmur   . History of colon polyps   . Hypertension   . Migraines   . Osteopenia     BP 140/84 (BP Location: Left Arm, Patient Position: Sitting, Cuff Size: Normal)   Pulse 84   Temp 98.2 F (36.8 C) (Oral)   Ht 5\' 2"  (1.575 m)   Wt 124 lb 4 oz (56.4 kg)   SpO2 96%   BMI 22.73 kg/m  Gen: awake, alert, appearing stated age Lungs: No accessory muscle use Skin: Over the lateral left leg just anterior to the fibular head, there is an approx 4 cm linear wound with around 0.8 cm separation in between.  Mildly tender to palpation.  There is soft tissue edema surrounding the area without drainage, erythema, fluctuance, excoriation Psych: Age appropriate judgment and insight  Wound of left lower extremity, initial encounter  Need for shingles vaccine - Plan: Varicella-zoster vaccine IM (Shingrix)  Major depressive disorder, severe ([name]), Chronic  TAO bid.  Keep clean and dry.  No signs of infection. Warning signs and symptoms verbalized and written down in AVS.  1st Shingrix today. F/u prn. The patient voiced understanding and agreement to the plan.  [place], DO 12/09/20 11:59 AM

## 2020-12-09 NOTE — Patient Instructions (Signed)
When you do wash it, use only soap and water. Do not vigorously scrub. Apply triple antibiotic ointment (like Neosporin) twice daily. Keep the area clean and dry.   Things to look out for: increasing pain not relieved by ibuprofen/acetaminophen, fevers, spreading redness, drainage of pus, or foul odor.  Let us know if you need anything.  

## 2020-12-11 ENCOUNTER — Telehealth: Payer: Self-pay | Admitting: Family Medicine

## 2020-12-11 NOTE — Telephone Encounter (Signed)
Have faxed over office notes///called the patient to inform

## 2020-12-11 NOTE — Telephone Encounter (Signed)
Patient states short term disability has not received doctors notes. Please call patient with status

## 2020-12-24 ENCOUNTER — Telehealth: Payer: Self-pay | Admitting: Family Medicine

## 2020-12-24 NOTE — Telephone Encounter (Signed)
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

## 2020-12-25 ENCOUNTER — Encounter (INDEPENDENT_AMBULATORY_CARE_PROVIDER_SITE_OTHER): Payer: Self-pay

## 2020-12-28 DIAGNOSIS — Z0279 Encounter for issue of other medical certificate: Secondary | ICD-10-CM

## 2020-12-28 NOTE — Telephone Encounter (Signed)
PCP completed and faxed form. Notified the patient.

## 2021-01-04 DIAGNOSIS — I10 Essential (primary) hypertension: Secondary | ICD-10-CM | POA: Diagnosis not present

## 2021-02-03 DIAGNOSIS — I10 Essential (primary) hypertension: Secondary | ICD-10-CM | POA: Diagnosis not present

## 2021-02-26 ENCOUNTER — Other Ambulatory Visit: Payer: Self-pay | Admitting: Family

## 2021-03-05 DIAGNOSIS — I10 Essential (primary) hypertension: Secondary | ICD-10-CM | POA: Diagnosis not present

## 2021-03-27 ENCOUNTER — Other Ambulatory Visit: Payer: Self-pay | Admitting: Family Medicine

## 2021-03-27 DIAGNOSIS — I10 Essential (primary) hypertension: Secondary | ICD-10-CM

## 2021-03-28 ENCOUNTER — Other Ambulatory Visit: Payer: Self-pay | Admitting: Family Medicine

## 2021-03-28 DIAGNOSIS — I10 Essential (primary) hypertension: Secondary | ICD-10-CM

## 2021-04-05 DIAGNOSIS — I10 Essential (primary) hypertension: Secondary | ICD-10-CM | POA: Diagnosis not present

## 2021-05-06 DIAGNOSIS — I10 Essential (primary) hypertension: Secondary | ICD-10-CM | POA: Diagnosis not present

## 2021-05-24 ENCOUNTER — Other Ambulatory Visit: Payer: Self-pay | Admitting: Family Medicine

## 2021-05-24 DIAGNOSIS — F341 Dysthymic disorder: Secondary | ICD-10-CM

## 2021-06-05 DIAGNOSIS — I10 Essential (primary) hypertension: Secondary | ICD-10-CM | POA: Diagnosis not present

## 2021-06-21 ENCOUNTER — Other Ambulatory Visit: Payer: Self-pay | Admitting: Family Medicine

## 2021-06-21 DIAGNOSIS — F341 Dysthymic disorder: Secondary | ICD-10-CM

## 2021-06-27 ENCOUNTER — Other Ambulatory Visit: Payer: Self-pay | Admitting: Family Medicine

## 2021-06-27 DIAGNOSIS — I10 Essential (primary) hypertension: Secondary | ICD-10-CM

## 2021-07-06 DIAGNOSIS — I10 Essential (primary) hypertension: Secondary | ICD-10-CM | POA: Diagnosis not present

## 2021-07-28 ENCOUNTER — Other Ambulatory Visit: Payer: Self-pay | Admitting: Family Medicine

## 2021-07-28 DIAGNOSIS — F341 Dysthymic disorder: Secondary | ICD-10-CM

## 2021-08-05 DIAGNOSIS — I10 Essential (primary) hypertension: Secondary | ICD-10-CM | POA: Diagnosis not present

## 2021-08-16 ENCOUNTER — Ambulatory Visit (INDEPENDENT_AMBULATORY_CARE_PROVIDER_SITE_OTHER): Payer: BC Managed Care – PPO | Admitting: Family Medicine

## 2021-08-16 ENCOUNTER — Encounter: Payer: Self-pay | Admitting: Family Medicine

## 2021-08-16 VITALS — BP 108/68 | HR 86 | Temp 98.4°F | Ht 62.0 in | Wt 130.1 lb

## 2021-08-16 DIAGNOSIS — Z23 Encounter for immunization: Secondary | ICD-10-CM | POA: Diagnosis not present

## 2021-08-16 DIAGNOSIS — Z0000 Encounter for general adult medical examination without abnormal findings: Secondary | ICD-10-CM | POA: Diagnosis not present

## 2021-08-16 DIAGNOSIS — F341 Dysthymic disorder: Secondary | ICD-10-CM | POA: Diagnosis not present

## 2021-08-16 LAB — COMPREHENSIVE METABOLIC PANEL
ALT: 15 U/L (ref 0–35)
AST: 16 U/L (ref 0–37)
Albumin: 4.4 g/dL (ref 3.5–5.2)
Alkaline Phosphatase: 94 U/L (ref 39–117)
BUN: 17 mg/dL (ref 6–23)
CO2: 28 mEq/L (ref 19–32)
Calcium: 10.1 mg/dL (ref 8.4–10.5)
Chloride: 103 mEq/L (ref 96–112)
Creatinine, Ser: 0.94 mg/dL (ref 0.40–1.20)
GFR: 63.89 mL/min (ref >60.00)
Glucose, Bld: 68 mg/dL — ABNORMAL LOW (ref 70–99)
Potassium: 4.8 mEq/L (ref 3.5–5.1)
Sodium: 141 mEq/L (ref 135–145)
Total Bilirubin: 0.5 mg/dL (ref 0.2–1.2)
Total Protein: 7.1 g/dL (ref 6.0–8.3)

## 2021-08-16 LAB — LIPID PANEL
Cholesterol: 220 mg/dL — ABNORMAL HIGH (ref 0–200)
HDL: 69.8 mg/dL (ref >39.00)
LDL Cholesterol: 124 mg/dL — ABNORMAL HIGH (ref 0–99)
NonHDL: 150.26
Total CHOL/HDL Ratio: 3
Triglycerides: 133 mg/dL (ref 0.0–149.0)
VLDL: 26.6 mg/dL (ref 0.0–40.0)

## 2021-08-16 LAB — TSH: TSH: 0.74 u[IU]/mL (ref 0.35–5.50)

## 2021-08-16 LAB — CBC
HCT: 42.2 % (ref 36.0–46.0)
Hemoglobin: 13.9 g/dL (ref 12.0–15.0)
MCHC: 32.9 g/dL (ref 30.0–36.0)
MCV: 90 fl (ref 78.0–100.0)
Platelets: 284 10*3/uL (ref 150.0–400.0)
RBC: 4.69 Mil/uL (ref 3.87–5.11)
RDW: 12.3 % (ref 11.5–15.5)
WBC: 5.6 10*3/uL (ref 4.0–10.5)

## 2021-08-16 MED ORDER — VENLAFAXINE HCL ER 75 MG PO CP24: ORAL_CAPSULE | ORAL | 2 refills | Status: DC

## 2021-08-16 NOTE — Patient Instructions (Addendum)
Give [place] 2-3 business days to get the results of your labs back.   Keep the diet clean and stay active.  I recommend getting the updated bivalent covid vaccination booster at your convenience.   The bruise can take up to a month to dissipate.  Foods that may reduce pain: 1) Ginger 2) Blueberries 3) Salmon 4) Pumpkin seeds 5) dark chocolate 6) turmeric 7) tart cherries 8) virgin olive oil 9) chilli peppers 10) mint  Let us know if you need anything.

## 2021-08-16 NOTE — Addendum Note (Signed)
Addended by: Sharon Seller B on: 08/16/2021 11:34 AM   Modules accepted: Orders

## 2021-08-16 NOTE — Progress Notes (Signed)
Chief Complaint  Patient presents with   Annual Exam    Check spot on back Black eye      Well Woman Casey Frazier is here for a complete physical.   Her last physical was >1 year ago.  Current diet: in general, a "healthy" diet. Current exercise: active at home, walking. Weight is up a few lbs and she denies daytime fatigue. Seatbelt? Yes Advanced directive? No  Health Maintenance Colonoscopy- Yes Shingrix- Yes; due for #2 today.  DEXA- Yes Mammogram- Yes Tetanus- Yes Pneumonia- Due for PCV20 Hep C screen- Yes  Past Medical History:  Diagnosis Date   Allergy    Anxiety    Colon polyps    Depression    Fibroid    history of uterine fibroids   GERD (gastroesophageal reflux disease)    Hayfever    Headache    migraines   Heart murmur    History of colon polyps    Hypertension    Migraines    Osteopenia      Past Surgical History:  Procedure Laterality Date   ABDOMINAL HYSTERECTOMY  1999   complete   BREAST BIOPSY Left    benign   COLONOSCOPY WITH PROPOFOL N/A 03/24/2017   Procedure: COLONOSCOPY WITH PROPOFOL;  Surgeon: Doran Stabler, MD;  Location: [facility];  Service: Gastroenterology;  Laterality: N/A;   colonscopy     with polyps removed   ESOPHAGOGASTRODUODENOSCOPY (EGD) WITH PROPOFOL N/A 03/24/2017   Procedure: ESOPHAGOGASTRODUODENOSCOPY (EGD) WITH PROPOFOL;  Surgeon: Doran Stabler, MD;  Location: [facility];  Service: Gastroenterology;  Laterality: N/A;   FOOT SURGERY      Medications  Current Outpatient Medications on File Prior to Visit  Medication Sig Dispense Refill   acetaminophen (TYLENOL) 500 MG tablet Take 250 mg by mouth at bedtime as needed for moderate pain or headache.     amLODipine (NORVASC) 5 MG tablet TAKE ONE TABLET BY MOUTH ONE TIME DAILY 90 tablet 0   Calcium Carbonate (CALCIUM 600 PO) Take 1-2 tablets by mouth daily.     Cyanocobalamin (VITAMIN B-12 PO) Take 1 tablet by mouth daily.     diphenhydrAMINE  (BENADRYL) 25 MG tablet Take 25 mg by mouth daily as needed for allergies.     lisinopril (ZESTRIL) 40 MG tablet TAKE ONE TABLET BY MOUTH ONE TIME DAILY 90 tablet 0   Multiple Vitamin (MULTIVITAMIN WITH MINERALS) TABS tablet Take 1 tablet by mouth daily.     Omega-3 Fatty Acids (FISH OIL PO) Take by mouth daily.     venlafaxine XR (EFFEXOR-XR) 75 MG 24 hr capsule TAKE ONE CAPSULE BY MOUTH ONE TIME DAILY WITH BREAKFAST 30 capsule 0    Allergies No Known Allergies  Review of Systems: Constitutional:  no fevers Eye:  no recent significant change in vision Ears:  No changes in hearing Nose/Mouth/Throat:  no complaints of nasal congestion, no sore throat Cardiovascular: no chest pain Respiratory:  No shortness of breath Gastrointestinal:  No change in bowel habits GU:  Female: negative for dysuria Integumentary:  no abnormal skin lesions reported Neurologic:  no headaches Endocrine:  denies unexplained weight changes  Exam BP 108/68   Pulse 86   Temp 98.4 F (36.9 C) (Oral)   Ht 5\' 2"  (1.575 m)   Wt 130 lb 2 oz (59 kg)   SpO2 99%   BMI 23.80 kg/m  General:  well developed, well nourished, in no apparent distress Skin:  no significant moles, warts,  or growths Head:  no masses, lesions, or tenderness Eyes:  pupils equal and round, sclera anicteric without injection Ears:  canals without lesions, TMs shiny without retraction, no obvious effusion, no erythema Nose:  nares patent, septum midline, mucosa normal, and no drainage or sinus tenderness Throat/Pharynx:  lips and gingiva without lesion; tongue and uvula midline; non-inflamed pharynx; no exudates or postnasal drainage Neck: neck supple without adenopathy, thyromegaly, or masses Lungs:  clear to auscultation, breath sounds equal bilaterally, no respiratory distress Cardio:  regular rate and rhythm, no bruits or LE edema Abdomen:  abdomen soft, nontender; bowel sounds normal; no masses or organomegaly Genital: Deferred Neuro:   gait normal; deep tendon reflexes normal and symmetric Psych: well oriented with normal range of affect and appropriate judgment/insight  Assessment and Plan  Well adult exam - Plan: CBC, Comprehensive metabolic panel, Lipid panel, TSH  DEPRESSION/ANXIETY - Plan: venlafaxine XR (EFFEXOR-XR) 75 MG 24 hr capsule   Well 65 y.o. female. Counseled on diet and exercise. Other orders as above. Bivalent covid booster rec'd. Shingrix #2 today in add'n to PCV20.  Follow up in 6 mo. The patient voiced understanding and agreement to the plan.  [place], DO 08/16/21 11:08 AM

## 2021-09-05 DIAGNOSIS — I10 Essential (primary) hypertension: Secondary | ICD-10-CM | POA: Diagnosis not present

## 2021-09-26 ENCOUNTER — Other Ambulatory Visit: Payer: Self-pay | Admitting: Family Medicine

## 2021-09-26 DIAGNOSIS — I10 Essential (primary) hypertension: Secondary | ICD-10-CM

## 2021-09-27 MED ORDER — LISINOPRIL 40 MG PO TABS: 40.0000 mg | ORAL_TABLET | Freq: Every day | ORAL | 1 refills | Status: DC

## 2021-10-06 DIAGNOSIS — I10 Essential (primary) hypertension: Secondary | ICD-10-CM | POA: Diagnosis not present

## 2021-10-20 ENCOUNTER — Other Ambulatory Visit: Payer: Self-pay | Admitting: Family Medicine

## 2021-10-20 DIAGNOSIS — Z1231 Encounter for screening mammogram for malignant neoplasm of breast: Secondary | ICD-10-CM

## 2021-11-05 DIAGNOSIS — I10 Essential (primary) hypertension: Secondary | ICD-10-CM | POA: Diagnosis not present

## 2021-11-10 ENCOUNTER — Ambulatory Visit
Admission: RE | Admit: 2021-11-10 | Discharge: 2021-11-10 | Disposition: A | Discharge status: Home / Self Care | Payer: BC Managed Care – PPO | Source: Ambulatory Visit | Attending: Family Medicine | Admitting: Family Medicine

## 2021-11-10 DIAGNOSIS — Z1231 Encounter for screening mammogram for malignant neoplasm of breast: Secondary | ICD-10-CM | POA: Diagnosis not present

## 2021-12-05 DIAGNOSIS — I10 Essential (primary) hypertension: Secondary | ICD-10-CM | POA: Diagnosis not present

## 2021-12-26 ENCOUNTER — Other Ambulatory Visit: Payer: Self-pay | Admitting: Family Medicine

## 2021-12-26 DIAGNOSIS — I10 Essential (primary) hypertension: Secondary | ICD-10-CM

## 2022-01-04 DIAGNOSIS — I10 Essential (primary) hypertension: Secondary | ICD-10-CM | POA: Diagnosis not present

## 2022-01-20 ENCOUNTER — Ambulatory Visit (INDEPENDENT_AMBULATORY_CARE_PROVIDER_SITE_OTHER): Payer: BC Managed Care – PPO | Admitting: Podiatry

## 2022-01-20 ENCOUNTER — Ambulatory Visit (INDEPENDENT_AMBULATORY_CARE_PROVIDER_SITE_OTHER): Payer: BC Managed Care – PPO

## 2022-01-20 DIAGNOSIS — M21612 Bunion of left foot: Secondary | ICD-10-CM

## 2022-01-20 DIAGNOSIS — M2012 Hallux valgus (acquired), left foot: Secondary | ICD-10-CM

## 2022-01-20 DIAGNOSIS — M21619 Bunion of unspecified foot: Secondary | ICD-10-CM

## 2022-01-20 DIAGNOSIS — M21962 Unspecified acquired deformity of left lower leg: Secondary | ICD-10-CM | POA: Diagnosis not present

## 2022-01-20 DIAGNOSIS — M79672 Pain in left foot: Secondary | ICD-10-CM | POA: Diagnosis not present

## 2022-01-20 DIAGNOSIS — R2689 Other abnormalities of gait and mobility: Secondary | ICD-10-CM | POA: Diagnosis not present

## 2022-01-23 NOTE — Progress Notes (Signed)
  Subjective:  Patient ID: Casey Frazier, female    DOB: [birth year],  MRN: [id number]  Chief Complaint  Patient presents with   Bunions    NP- pt has a bunion that hurts really bad and pt wants it removed on L foot    66 y.o. female presents with the above complaint. History confirmed with patient.  She had surgery on her right foot bunion in 2006  Objective:  Physical Exam: warm, good capillary refill, no trophic changes or ulcerative lesions, normal DP and PT pulses, and normal sensory exam. Left Foot:  Hallux valgus deformity that is reducible no pain with range of motion good range of motion of the joint, there is hyperkeratosis submetatarsal 2 and 3 plantar   Radiographs: Multiple views x-ray of the left foot: Hallux valgus deformity with moderate increased IM angle, significant increase in the hallux abduction angle and rotation of the first ray and sesamoid axial view, there is prominence of the second third and fourth metatarsals and the metatarsal parabola compared to the first. Assessment:   1. Hallux valgus with bunions, left   2. Acquired hallux interphalangeus of left foot   3. Deformity of metatarsal bone of left foot   4. Pain in left foot   5. Inability to bear weight      Plan:  Patient was evaluated and treated and all questions answered.  Discussed the etiology and treatment including surgical and non surgical treatment for painful bunions.  She has exhausted all non surgical treatment prior to this visit including shoe gear changes and padding.  She desires surgical intervention. We discussed all risks including but not limited to: pain, swelling, infection, scar, numbness which may be temporary or permanent, chronic pain, stiffness, nerve pain or damage, wound healing problems, bone healing problems including delayed or non-union and recurrence. Specifically we discussed the following procedures: Lapidus bunionectomy with bone graft from the heel, possible Akin  osteotomy, and central metatarsal distal metaphyseal metatarsal osteotomies to realign the metatarsal parabola and offload pressure. Informed consent was signed today. Surgery will be scheduled at a mutually agreeable date. Information regarding this will be forwarded to our surgery scheduler.   Surgical plan:  Procedure: -Left foot Lapidus with possible Akin, bone graft from heel, central metatarsal osteotomies 2, 3, 4  Location: -[facility]  Anesthesia plan: -IV sedation with regional block  Postoperative pain plan: - Tylenol 1000 mg every 6 hours, ibuprofen 600 mg every 6 hours, gabapentin 300 mg every 8 hours x5 days, oxycodone 5 mg 1-2 tabs every 6 hours only as needed  DVT prophylaxis: -ASA 325 mg twice daily  WB Restrictions / DME needs: -NWB in CAM boot which was dispensed today  No follow-ups on file.

## 2022-02-03 DIAGNOSIS — I10 Essential (primary) hypertension: Secondary | ICD-10-CM | POA: Diagnosis not present

## 2022-02-11 DIAGNOSIS — M79676 Pain in unspecified toe(s): Secondary | ICD-10-CM

## 2022-03-21 ENCOUNTER — Telehealth: Payer: Self-pay | Admitting: Podiatry

## 2022-03-21 NOTE — Telephone Encounter (Signed)
Casey Frazier is scheduled for surgery 04/15/22, but she said her foot is hurting and swelling and she would like to go out of work on 03/28/2022 instead of waiting until surgery date. Please advise

## 2022-03-22 NOTE — Telephone Encounter (Signed)
Addressed yesterday via secure chat

## 2022-03-25 ENCOUNTER — Other Ambulatory Visit: Payer: Self-pay | Admitting: Family Medicine

## 2022-03-25 ENCOUNTER — Encounter: Payer: Self-pay | Admitting: Podiatry

## 2022-03-25 DIAGNOSIS — I10 Essential (primary) hypertension: Secondary | ICD-10-CM

## 2022-03-30 ENCOUNTER — Telehealth: Payer: Self-pay | Admitting: Urology

## 2022-03-30 NOTE — Telephone Encounter (Signed)
DOS - 04/15/22  LAPIDUS PROCEDURE INCLUDING BUNIONECTOMY LEFT --- 97282 Barbie Banner OSTEOTOMY LEFT --- 06015 METATARSAL OSTEOTOMY 2-4 LEFT --- 61537 BONE GRAFT LEFT --- 20900  [employer] EFFECTIVE DATE - 09/05/21  PLAN DEDUCTIBLE - $3,000.00 W/ $2,229.42 REMAINING OUT OF POCKET - $4,500.00 W/ $3,729.42 REMAINING COINSURANCE - 20% COPAY - $0.00  NO PRIOR AUTH IS REQUIRED

## 2022-04-15 ENCOUNTER — Other Ambulatory Visit: Payer: Self-pay | Admitting: Podiatry

## 2022-04-15 DIAGNOSIS — M21542 Acquired clubfoot, left foot: Secondary | ICD-10-CM | POA: Diagnosis not present

## 2022-04-15 DIAGNOSIS — M2042 Other hammer toe(s) (acquired), left foot: Secondary | ICD-10-CM | POA: Diagnosis not present

## 2022-04-15 DIAGNOSIS — M7742 Metatarsalgia, left foot: Secondary | ICD-10-CM | POA: Diagnosis not present

## 2022-04-15 DIAGNOSIS — M2012 Hallux valgus (acquired), left foot: Secondary | ICD-10-CM | POA: Diagnosis not present

## 2022-04-15 DIAGNOSIS — G8918 Other acute postprocedural pain: Secondary | ICD-10-CM | POA: Diagnosis not present

## 2022-04-15 DIAGNOSIS — M21612 Bunion of left foot: Secondary | ICD-10-CM | POA: Diagnosis not present

## 2022-04-15 DIAGNOSIS — M89772 Major osseous defect, left ankle and foot: Secondary | ICD-10-CM | POA: Diagnosis not present

## 2022-04-15 MED ORDER — ACETAMINOPHEN 500 MG PO TABS: 1000.0000 mg | ORAL_TABLET | Freq: Four times a day (QID) | ORAL | 0 refills | Status: AC | PRN

## 2022-04-15 MED ORDER — GABAPENTIN 300 MG PO CAPS: 300.0000 mg | ORAL_CAPSULE | Freq: Three times a day (TID) | ORAL | 0 refills | Status: DC

## 2022-04-15 MED ORDER — OXYCODONE HCL 5 MG PO TABS
5.0000 mg | ORAL_TABLET | ORAL | 0 refills | Status: AC | PRN
Start: 2022-04-15 — End: 2022-04-22

## 2022-04-15 MED ORDER — IBUPROFEN 600 MG PO TABS: 600.0000 mg | ORAL_TABLET | Freq: Four times a day (QID) | ORAL | 0 refills | Status: AC | PRN

## 2022-04-21 ENCOUNTER — Ambulatory Visit (INDEPENDENT_AMBULATORY_CARE_PROVIDER_SITE_OTHER): Payer: BC Managed Care – PPO | Admitting: Podiatry

## 2022-04-21 ENCOUNTER — Ambulatory Visit (INDEPENDENT_AMBULATORY_CARE_PROVIDER_SITE_OTHER): Payer: BC Managed Care – PPO

## 2022-04-21 DIAGNOSIS — M2012 Hallux valgus (acquired), left foot: Secondary | ICD-10-CM

## 2022-04-21 DIAGNOSIS — M21612 Bunion of left foot: Secondary | ICD-10-CM

## 2022-04-21 NOTE — Progress Notes (Signed)
  Subjective:  Patient ID: Casey Frazier, female    DOB: [birth year],  MRN: [id number]  Chief Complaint  Patient presents with   Routine Post Op      (xray)POV #1 DOS 04/15/2022 BUNION CORRECTION OF LT FOOT W/BONE GRAFT FROM HEEL, BONE CUTS IN THE METATARSAL BONES BEHIND 2-4     66 y.o. female returns for post-op check.  Overall doing well having some pain  Review of Systems: Negative except as noted in the HPI. Denies N/V/F/Ch.   Objective:  There were no vitals filed for this visit. There is no height or weight on file to calculate BMI. Constitutional Well developed. Well nourished.  Vascular Foot warm and well perfused. Capillary refill normal to all digits.  Calf is soft and supple, no posterior calf or knee pain, negative Homans' sign  Neurologic Normal speech. Oriented to person, place, and time. Epicritic sensation to light touch grossly present bilaterally.  Dermatologic Skin healing well without signs of infection. Skin edges well coapted without signs of infection.  Orthopedic: Tenderness to palpation noted about the surgical site.   Multiple view plain film radiographs: Status post Lapidus bunionectomy, good correction, hardware intact and equivalent to immediate postoperative films Assessment:   1. Hallux valgus with bunions, left    Plan:  Patient was evaluated and treated and all questions answered.  S/p foot surgery left -Progressing as expected post-operatively. -XR: Noted above -WB Status: NWB in CAM boot.  May begin weightbearing after next visit -Sutures: Plan to remove in 2 weeks. -Medications: No refills required -Foot redressed.  May transition to bathing after next visit  Return in about 2 weeks (around 05/05/2022) for suture removal.

## 2022-05-04 ENCOUNTER — Ambulatory Visit (INDEPENDENT_AMBULATORY_CARE_PROVIDER_SITE_OTHER): Payer: BC Managed Care – PPO | Admitting: Podiatry

## 2022-05-04 DIAGNOSIS — M2012 Hallux valgus (acquired), left foot: Secondary | ICD-10-CM

## 2022-05-04 DIAGNOSIS — M21612 Bunion of left foot: Secondary | ICD-10-CM

## 2022-05-04 DIAGNOSIS — M21962 Unspecified acquired deformity of left lower leg: Secondary | ICD-10-CM

## 2022-05-04 DIAGNOSIS — Z9889 Other specified postprocedural states: Secondary | ICD-10-CM

## 2022-05-04 NOTE — Progress Notes (Signed)
  Subjective:  Patient ID: Casey Frazier, female    DOB: 1955/12/14,  MRN: [id number]  Chief Complaint  Patient presents with   Routine Post Op    POV #2 DOS 04/15/2022 BUNION CORRECTION OF LT FOOT W/BONE GRAFT FROM HEEL, BONE CUTS IN THE METATARSAL BONES BEHIND 2-4/DR MCDONALD PT     66 y.o. female returns for post-op check.  Overall doing well having some pain.  Overall doing much better bandages clean dry and intact.  She is here for suture removal.  She is known to Dr. Sherryle Lis  Review of Systems: Negative except as noted in the HPI. Denies N/V/F/Ch.   Objective:  There were no vitals filed for this visit. There is no height or weight on file to calculate BMI. Constitutional Well developed. Well nourished.  Vascular Foot warm and well perfused. Capillary refill normal to all digits.  Calf is soft and supple, no posterior calf or knee pain, negative Homans' sign  Neurologic Normal speech. Oriented to person, place, and time. Epicritic sensation to light touch grossly present bilaterally.  Dermatologic No wound dehiscence noted.  Good reduction of bunion deformity noted.  Orthopedic: Tenderness to palpation noted about the surgical site.   Multiple view plain film radiographs: Status post Lapidus bunionectomy, good correction, hardware intact and equivalent to immediate postoperative films Assessment:   1. Hallux valgus with bunions, left   2. Acquired hallux interphalangeus of left foot   3. Deformity of metatarsal bone of left foot   4. S/P foot surgery     Plan:  Patient was evaluated and treated and all questions answered.  S/p foot surgery left -Progressing as expected post-operatively. -XR: Noted above -WB Status: Begin with bearing as tolerated in cam boot -Sutures: Suture removed.  No dehiscence noted no complications noted. -Medications: No refills required -Ace wrap was applied for soft tissue swelling  No follow-ups on file.

## 2022-05-22 ENCOUNTER — Other Ambulatory Visit: Payer: Self-pay | Admitting: Family Medicine

## 2022-05-22 DIAGNOSIS — F341 Dysthymic disorder: Secondary | ICD-10-CM

## 2022-05-26 ENCOUNTER — Encounter: Payer: BC Managed Care – PPO | Admitting: Podiatry

## 2022-05-30 ENCOUNTER — Ambulatory Visit (INDEPENDENT_AMBULATORY_CARE_PROVIDER_SITE_OTHER): Payer: BC Managed Care – PPO

## 2022-05-30 ENCOUNTER — Ambulatory Visit (INDEPENDENT_AMBULATORY_CARE_PROVIDER_SITE_OTHER): Payer: BC Managed Care – PPO | Admitting: Podiatry

## 2022-05-30 DIAGNOSIS — M2012 Hallux valgus (acquired), left foot: Secondary | ICD-10-CM | POA: Diagnosis not present

## 2022-05-30 DIAGNOSIS — M21612 Bunion of left foot: Secondary | ICD-10-CM

## 2022-05-30 DIAGNOSIS — M21962 Unspecified acquired deformity of left lower leg: Secondary | ICD-10-CM

## 2022-05-30 NOTE — Patient Instructions (Signed)
Look for urea 40% cream or ointment and apply to the thickened dry skin / calluses. This can be bought over the counter, at a pharmacy or online such as [employer].  

## 2022-05-31 ENCOUNTER — Encounter: Payer: Self-pay | Admitting: Podiatry

## 2022-05-31 NOTE — Progress Notes (Signed)
  Subjective:  Patient ID: Casey Frazier, female    DOB: [birth year],  MRN: [id number]  Chief Complaint  Patient presents with   Routine Post Op    POV #3 DOS 04/15/2022 BUNION CORRECTION OF LT FOOT W/BONE GRAFT FROM HEEL, BONE CUTS IN THE METATARSAL BONES BEHIND 2-4     66 y.o. female returns for post-op check.  Overall doing well her pain is improved quite a bit still swollen  Review of Systems: Negative except as noted in the HPI. Denies N/V/F/Ch.   Objective:  There were no vitals filed for this visit. There is no height or weight on file to calculate BMI. Constitutional Well developed. Well nourished.  Vascular Foot warm and well perfused. Capillary refill normal to all digits.  Calf is soft and supple, no posterior calf or knee pain, negative Homans' sign  Neurologic Normal speech. Oriented to person, place, and time. Epicritic sensation to light touch grossly present bilaterally.  Dermatologic Incisions are well-healed and not hypertrophic  Orthopedic: Little to no tenderness to palpation noted about the surgical site.  Mild edema   Multiple view plain film radiographs: New films taken today show good consolidation across the dorsal portion of the Lapidus fusion site, there is increased lateral displacement of the second metatarsal head osteotomy, early bone callus forming at metatarsal osteotomy sites Assessment:   1. Hallux valgus with bunions, left   2. Acquired hallux interphalangeus of left foot   3. Deformity of metatarsal bone of left foot    Plan:  Patient was evaluated and treated and all questions answered.  S/p foot surgery left -Continues to improve.  We reviewed her x-rays.  We discussed the presence of the lateral displacement.  Hopefully will be relatively asymptomatic and still can heal with bone callus formation.  We will continue to monitor.  Showing early signs of fusion across the Lapidus site I do think she needs to be in the cam boot for another 4  weeks.  I will see her back in 6 weeks for new radiographs  Return in about 6 weeks (around 07/11/2022).

## 2022-06-13 ENCOUNTER — Encounter: Payer: Self-pay | Admitting: Podiatry

## 2022-06-19 ENCOUNTER — Other Ambulatory Visit: Payer: Self-pay | Admitting: Family Medicine

## 2022-06-19 DIAGNOSIS — I10 Essential (primary) hypertension: Secondary | ICD-10-CM

## 2022-06-28 ENCOUNTER — Telehealth: Payer: Self-pay | Admitting: Podiatry

## 2022-06-28 ENCOUNTER — Encounter: Payer: Self-pay | Admitting: Podiatry

## 2022-06-28 NOTE — Telephone Encounter (Signed)
Patient called and would like to extend her leave until after her next visit with you on 07/11/2022. She said that she is still experiencing swelling, pain and her toes wont move. Please advise?

## 2022-07-11 ENCOUNTER — Ambulatory Visit (INDEPENDENT_AMBULATORY_CARE_PROVIDER_SITE_OTHER): Payer: BC Managed Care – PPO | Admitting: Podiatry

## 2022-07-11 ENCOUNTER — Ambulatory Visit (INDEPENDENT_AMBULATORY_CARE_PROVIDER_SITE_OTHER): Payer: BC Managed Care – PPO

## 2022-07-11 DIAGNOSIS — M2012 Hallux valgus (acquired), left foot: Secondary | ICD-10-CM

## 2022-07-11 DIAGNOSIS — M21612 Bunion of left foot: Secondary | ICD-10-CM

## 2022-07-11 DIAGNOSIS — M21962 Unspecified acquired deformity of left lower leg: Secondary | ICD-10-CM

## 2022-07-11 DIAGNOSIS — Z9889 Other specified postprocedural states: Secondary | ICD-10-CM

## 2022-07-11 NOTE — Patient Instructions (Signed)
Call [phone] to schedule physical therapy

## 2022-07-12 ENCOUNTER — Encounter: Payer: Self-pay | Admitting: Podiatry

## 2022-07-12 NOTE — Progress Notes (Signed)
  Subjective:  Patient ID: Casey Frazier, female    DOB: September 12, 1955,  MRN: [id number]  Chief Complaint  Patient presents with   Bunions    6 week follow up left foot     66 y.o. female returns for post-op check.  She is doing well she is occasional tenderness she is still wearing the cam boot  Review of Systems: Negative except as noted in the HPI. Denies N/V/F/Ch.   Objective:  There were no vitals filed for this visit. There is no height or weight on file to calculate BMI. Constitutional Well developed. Well nourished.  Vascular Foot warm and well perfused. Capillary refill normal to all digits.  Calf is soft and supple, no posterior calf or knee pain, negative Homans' sign  Neurologic Normal speech. Oriented to person, place, and time. Epicritic sensation to light touch grossly present bilaterally.  Dermatologic Incisions are well-healed and not hypertrophic  Orthopedic: Little to no tenderness to palpation noted about the surgical site.  Minimal edema, there is some limited range of motion in dorsiflexion of the hallux   Multiple view plain film radiographs: New films taken today show good continued healing across the Lapidus fusion site, there is continued bone callus formation around the second third and fourth metatarsals Assessment:   1. Hallux valgus with bunions, left   2. Post-operative state   3. Acquired hallux interphalangeus of left foot   4. Deformity of metatarsal bone of left foot    Plan:  Patient was evaluated and treated and all questions answered.  S/p foot surgery left -Continues to improve.  We reviewed her x-rays.  I recommended her that she begin to transition back to regular shoe gear that is supportive such as a running shoe or sneaker.  She does have some stiffness in the toe.  I recommended physical therapy for this and referral was sent.  She should be able to go back to work the first full week of December  Return in about 6 weeks (around  08/22/2022) for surgery follow up (new left foot xrays).

## 2022-07-14 NOTE — Therapy (Signed)
OUTPATIENT PHYSICAL THERAPY LOWER EXTREMITY EVALUATION   Patient Name: Casey Frazier MRN: [id number] DOB:09/29/1955, 66 y.o., female Today's Date: 07/14/2022    Past Medical History:  Diagnosis Date   Allergy    Anxiety    Colon polyps    Depression    Fibroid    history of uterine fibroids   GERD (gastroesophageal reflux disease)    Hayfever    Headache    migraines   Heart murmur    History of colon polyps    Hypertension    Migraines    Osteopenia    Past Surgical History:  Procedure Laterality Date   ABDOMINAL HYSTERECTOMY  1999   complete   BREAST BIOPSY Left    benign   COLONOSCOPY WITH PROPOFOL N/A 03/24/2017   Procedure: COLONOSCOPY WITH PROPOFOL;  Surgeon: Doran Stabler, MD;  Location: [facility];  Service: Gastroenterology;  Laterality: N/A;   colonscopy     with polyps removed   ESOPHAGOGASTRODUODENOSCOPY (EGD) WITH PROPOFOL N/A 03/24/2017   Procedure: ESOPHAGOGASTRODUODENOSCOPY (EGD) WITH PROPOFOL;  Surgeon: Doran Stabler, MD;  Location: [facility];  Service: Gastroenterology;  Laterality: N/A;   FOOT SURGERY     Patient Active Problem List   Diagnosis Date Noted   Major depressive disorder, severe ([place]) 11/18/2020   Osteopenia    Numbness 03/05/2018   Muscle inflammation 03/23/2017   GAD (generalized anxiety disorder) 03/19/2007   Essential hypertension 03/19/2007   HEADACHE 03/19/2007   DEPRESSION/ANXIETY 01/22/2007    PCP: Shelda Pal, DO   REFERRING PROVIDER: Criselda Peaches, DPM   REFERRING DIAG: M20.12,M21.612 (ICD-10-CM) - Hallux valgus with bunions, left  Z98.890 (ICD-10-CM) - Post-operative state  Patient is status post bunion correction Aug 11 , would like to include ROM of toes, intrinsic muscle strengthening, stability, and scar massage. Modalities PRN at therapist's discretion.   THERAPY DIAG:  No diagnosis found.  Rationale for Evaluation and Treatment: Rehabilitation  ONSET DATE:  04/15/22  SUBJECTIVE:   SUBJECTIVE STATEMENT: ***  PERTINENT HISTORY: Anxiety, depression, HTN, osteopenia PAIN:  Are you having pain? Yes: NPRS scale: ***/10 Pain location: *** Pain description: *** Aggravating factors: *** Relieving factors: ***  PRECAUTIONS: {Therapy precautions:24002}  WEIGHT BEARING RESTRICTIONS: {Yes ***/No:24003}  FALLS:  Has patient fallen in last 6 months? {fallsyesno:27318}  LIVING ENVIRONMENT: Lives with: {[facility] lives with:25569::"lives with their family"} Lives in: {Lives in:25570} Stairs: {opstairs:27293} Has following equipment at home: {Assistive devices:23999}  OCCUPATION: ***  PLOF: {PLOF:24004}  PATIENT GOALS: ***  NEXT MD VISIT:   OBJECTIVE:   DIAGNOSTIC FINDINGS: ***  PATIENT SURVEYS:  LEFS ***  COGNITION: Overall cognitive status: Within functional limits for tasks assessed     SENSATION: {sensation:27233}  EDEMA:  {edema:24020}  MUSCLE LENGTH: HS: Quads: ITB: Piriformis: Hip Flexors: Heelcords:   POSTURE: {posture:25561}  PALPATION: ***  LOWER EXTREMITY ROM:  A/P ROM Right eval Left eval  Hip flexion    Hip extension    Hip abduction    Hip adduction    Hip internal rotation    Hip external rotation    Knee flexion    Knee extension    Ankle dorsiflexion    Ankle plantarflexion    Ankle inversion    Ankle eversion    Great toe extension    Great toe flexion             (Blank rows = not tested)  LOWER EXTREMITY MMT:  MMT Right eval Left eval  Hip flexion  Hip extension    Hip abduction    Hip adduction    Hip internal rotation    Hip external rotation    Knee flexion    Knee extension    Ankle dorsiflexion    Ankle plantarflexion    Ankle inversion    Ankle eversion     (Blank rows = not tested)  LOWER EXTREMITY SPECIAL TESTS:  {LEspecialtests:26242}  FUNCTIONAL TESTS:  {Functional tests:24029}  GAIT: Distance walked: *** Assistive device utilized: {Assistive  devices:23999} Level of assistance: {Levels of assistance:24026} Comments: ***   TODAY'S TREATMENT:                                                                                                                              DATE: ***  07/18/22 See Pt ed  PATIENT EDUCATION:  Education details: {Education details:27468}  Person educated: Patient Education method: Explanation, Demonstration, Verbal cues, and Handouts Education comprehension: verbalized understanding and returned demonstration  HOME EXERCISE PROGRAM: ***  ASSESSMENT:  CLINICAL IMPRESSION: Casey Frazier is a 66 y.o. female who was seen today for physical therapy evaluation and treatment for her left foot s/p bunionectomy on 04/15/22. She presents with expected limitations in strength and ROM as well as gait abnormalities. She will benefit from skilled PT to address these deficits.   OBJECTIVE IMPAIRMENTS: {opptimpairments:25111}.   ACTIVITY LIMITATIONS: {activitylimitations:27494}  PARTICIPATION LIMITATIONS: {participationrestrictions:25113}  PERSONAL FACTORS: {Personal factors:25162} are also affecting patient's functional outcome.   REHAB POTENTIAL: {rehabpotential:25112}  CLINICAL DECISION MAKING: {clinical decision making:25114}  EVALUATION COMPLEXITY: {Evaluation complexity:25115}   GOALS: Goals reviewed with patient? Yes  SHORT TERM GOALS: Target date: {follow up:25551} (Remove Blue Hyperlink)  Patient will be independent with initial HEP. Baseline:  Goal status: INITIAL  2.  *** Baseline: *** Goal status: {GOALSTATUS:25110}  3.  *** Baseline: *** Goal status: {GOALSTATUS:25110}   LONG TERM GOALS: Target date: {follow up:25551} (Remove Blue Hyperlink)  Patient will be independent with advanced/ongoing HEP to improve outcomes and carryover.  Baseline:  Goal status: INITIAL  2.  Patient will report at least 75% improvement in L foot pain to improve QOL. Baseline:  Goal status:  INITIAL  3.  Patient will demonstrate improved *** toe AROM to {Functional status:27472} to allow for normal gait and stair mechanics. Baseline:  Goal status: INITIAL  4.  Patient will demonstrate improved functional LE strength as demonstrated by ***. Baseline:  Goal status: INITIAL  5.  Patient will be able to ambulate 600' with LRAD and normal gait pattern without increased foot/ankle pain to access community.  Baseline:  Goal status: INITIAL  6. Patient will be able to ascend/descend stairs with 1 HR and reciprocal step pattern safely to access home and community.  Baseline:  Goal status: INITIAL  7.  Patient will report *** on LEFS to demonstrate improved functional ability. Baseline:  Goal status: INITIAL  8.  Patient will demonstrate at least 19/24 on DGI to decrease risk of  falls. Baseline:  Goal status: INITIAL   9.  *** Baseline: *** Goal status: INITIAL'   PLAN:  PT FREQUENCY: {rehab frequency:25116}  PT DURATION: {rehab duration:25117}  PLANNED INTERVENTIONS: Therapeutic exercises, Therapeutic activity, Neuromuscular re-education, Balance training, Gait training, Patient/Family education, Self Care, Joint mobilization, Stair training, Aquatic Therapy, Dry Needling, Electrical stimulation, Cryotherapy, Moist heat, scar mobilization, Taping, Vasopneumatic device, Ionotophoresis '4mg'$ /ml Dexamethasone, Manual therapy, and ***  PLAN FOR NEXT SESSION: ***   Haidyn Chadderdon, PT 07/14/2022, 10:50 AM

## 2022-07-18 ENCOUNTER — Ambulatory Visit: Payer: Self-pay | Attending: Podiatry | Admitting: Physical Therapy

## 2022-07-18 ENCOUNTER — Other Ambulatory Visit: Payer: Self-pay

## 2022-07-18 ENCOUNTER — Encounter: Payer: Self-pay | Admitting: Physical Therapy

## 2022-07-18 ENCOUNTER — Ambulatory Visit (INDEPENDENT_AMBULATORY_CARE_PROVIDER_SITE_OTHER): Payer: Self-pay

## 2022-07-18 DIAGNOSIS — Z23 Encounter for immunization: Secondary | ICD-10-CM

## 2022-07-18 DIAGNOSIS — M25572 Pain in left ankle and joints of left foot: Secondary | ICD-10-CM | POA: Insufficient documentation

## 2022-07-18 DIAGNOSIS — M6281 Muscle weakness (generalized): Secondary | ICD-10-CM | POA: Insufficient documentation

## 2022-07-18 DIAGNOSIS — M25675 Stiffness of left foot, not elsewhere classified: Secondary | ICD-10-CM | POA: Insufficient documentation

## 2022-07-18 DIAGNOSIS — R2689 Other abnormalities of gait and mobility: Secondary | ICD-10-CM | POA: Insufficient documentation

## 2022-07-18 DIAGNOSIS — M21612 Bunion of left foot: Secondary | ICD-10-CM | POA: Insufficient documentation

## 2022-07-18 DIAGNOSIS — R600 Localized edema: Secondary | ICD-10-CM | POA: Insufficient documentation

## 2022-07-18 DIAGNOSIS — M2012 Hallux valgus (acquired), left foot: Secondary | ICD-10-CM | POA: Insufficient documentation

## 2022-07-18 DIAGNOSIS — Z9889 Other specified postprocedural states: Secondary | ICD-10-CM | POA: Insufficient documentation

## 2022-07-18 NOTE — Progress Notes (Signed)
Pt here for high doze flu shot  Tolerated Well Left Deltoid

## 2022-07-21 ENCOUNTER — Ambulatory Visit: Payer: Self-pay

## 2022-07-24 NOTE — Therapy (Incomplete)
OUTPATIENT PHYSICAL THERAPY TREATMENT NOTE   Patient Name: Casey Frazier MRN: [id number] DOB:1955/12/23, 66 y.o., female Today's Date: 07/24/2022     Past Medical History:  Diagnosis Date   Allergy    Anxiety    Colon polyps    Depression    Fibroid    history of uterine fibroids   GERD (gastroesophageal reflux disease)    Hayfever    Headache    migraines   Heart murmur    History of colon polyps    Hypertension    Migraines    Osteopenia    Past Surgical History:  Procedure Laterality Date   ABDOMINAL HYSTERECTOMY  1999   complete   BREAST BIOPSY Left    benign   COLONOSCOPY WITH PROPOFOL N/A 03/24/2017   Procedure: COLONOSCOPY WITH PROPOFOL;  Surgeon: Doran Stabler, MD;  Location: [facility];  Service: Gastroenterology;  Laterality: N/A;   colonscopy     with polyps removed   ESOPHAGOGASTRODUODENOSCOPY (EGD) WITH PROPOFOL N/A 03/24/2017   Procedure: ESOPHAGOGASTRODUODENOSCOPY (EGD) WITH PROPOFOL;  Surgeon: Doran Stabler, MD;  Location: [facility];  Service: Gastroenterology;  Laterality: N/A;   FOOT SURGERY     Patient Active Problem List   Diagnosis Date Noted   Major depressive disorder, severe ([place]) 11/18/2020   Osteopenia    Numbness 03/05/2018   Muscle inflammation 03/23/2017   GAD (generalized anxiety disorder) 03/19/2007   Essential hypertension 03/19/2007   HEADACHE 03/19/2007   DEPRESSION/ANXIETY 01/22/2007    PCP: Shelda Pal, DO   REFERRING PROVIDER: Criselda Peaches, DPM   REFERRING DIAG: M20.12,M21.612 (ICD-10-CM) - Hallux valgus with bunions, left  Z98.890 (ICD-10-CM) - Post-operative state  Patient is status post bunion correction Aug 11 , would like to include ROM of toes, intrinsic muscle strengthening, stability, and scar massage. Modalities PRN at therapist's discretion.   THERAPY DIAG:  No diagnosis found.  Rationale for Evaluation and Treatment: Rehabilitation  ONSET DATE: 04/15/22  SUBJECTIVE:    SUBJECTIVE STATEMENT: ***  PERTINENT HISTORY: Anxiety, depression, HTN, osteopenia PAIN:  Are you having pain? Yes: NPRS scale: 0 today up to 6/10 Pain location: left toes Pain description: sharp Aggravating factors: prolonged standing and walking Relieving factors: rest  PRECAUTIONS: None  WEIGHT BEARING RESTRICTIONS: No  FALLS:  Has patient fallen in last 6 months? Yes. Number of falls 2-3 dog pulled her down a couple of times and fell once the day of surgery due to meds  LIVING ENVIRONMENT: Lives with: lives alone Lives in: House/apartment Stairs: No Has following equipment at home: Single point cane, Environmental consultant - 2 wheeled, and scooter  OCCUPATION: desk job  PLOF: Independent  PATIENT GOALS: get the swelling down so I can where nicer shoes  NEXT MD VISIT: 08/22/22  OBJECTIVE:   DIAGNOSTIC FINDINGS:  07/11/22 XR - healing well  PATIENT SURVEYS:  LEFS  22 / 80 = 27.5 %  COGNITION: Overall cognitive status: Within functional limits for tasks assessed     SENSATION: Reports numbness over metatarsals  EDEMA:  Circumferential: R = 20 cm  L =20.8 cm   MUSCLE LENGTH: Heelcords: see ROM   POSTURE:  hallux valgus bil  PALPATION: Tender along incisions, some numbness over metatarsals  LOWER EXTREMITY ROM:  A/P ROM Right eval Left eval  Ankle dorsiflexion 2/6 -6/4  Ankle plantarflexion    Ankle inversion    Ankle eversion    Great toe extension MTP 55 46  Great toe flexion MTP 40 15  Great toe ABD 18 (28 deg add to 10 deg adduction) 16 (28 deg add to 12 deg Adduction)       (Blank rows = WFL)  LOWER EXTREMITY MMT:  MMT Right eval Left eval  Hip flexion  4+  Hip extension    Hip abduction    Hip adduction    Knee flexion  4  Knee extension  4+  Ankle dorsiflexion  4+  Ankle plantarflexion  4 sitting  Ankle inversion  4  Ankle eversion  5   (Blank rows = not tested)  GAIT: Distance walked: 60 Assistive device utilized: None Level of  assistance: Complete Independence Comments: decreased step length R, decreased heel strike and toe off L  FUNCTIONAL TESTS:  Berg Balance Scale: TBD   TODAY'S TREATMENT:                                                                                                                              DATE:    07/25/22 ***    07/18/22 See Pt ed  PATIENT EDUCATION:  Education details: PT eval findings, anticipated POC, need for further assessment of balance, initial HEP, scar massage, and discussed use of ice/elevation.  Person educated: Patient Education method: Explanation, Demonstration, Verbal cues, and Handouts Education comprehension: verbalized understanding and returned demonstration  HOME EXERCISE PROGRAM: Access Code: [email or web address] URL: [email or web address] Date: 07/18/2022 Prepared by: Almyra Free  Exercises - Seated Ankle Circles  - 2 x daily - 7 x weekly - 2 sets - 10 reps - Seated Self Great Toe Stretch  - 2 x daily - 7 x weekly - 1 sets - 3 reps - 30 sec hold - Toe Yoga - Alternating Great Toe and Lesser Toe Extension (Mirrored)  - 2 x daily - 7 x weekly - 1 sets - 10 reps - Seated Arch Lifts  - 2 x daily - 7 x weekly - 1-2 sets - 10 reps - 5 sec hold - Toe Spreading  - 2 x daily - 7 x weekly - 1-2 sets - 10 reps - Seated Toe Towel Scrunches  - 2 x daily - 7 x weekly - 3 sets - 10 reps - Seated Soleus Stretch  - 2 x daily - 7 x weekly - 1 sets - 3 reps - 30-60 sec hold  ASSESSMENT:  CLINICAL IMPRESSION: Casey Frazier I***  OBJECTIVE IMPAIRMENTS: Abnormal gait, decreased balance, decreased mobility, decreased ROM, decreased strength, increased edema, increased fascial restrictions, increased muscle spasms, impaired flexibility, postural dysfunction, and pain.   ACTIVITY LIMITATIONS: standing, squatting, stairs, and locomotion level  PARTICIPATION LIMITATIONS: occupation and walking her dog  PERSONAL FACTORS: 3+ comorbidities: Anxiety, depression, HTN,  osteopenia  are also affecting patient's functional outcome.   REHAB POTENTIAL: Excellent  CLINICAL DECISION MAKING: Stable/uncomplicated  EVALUATION COMPLEXITY: Low   GOALS: Goals reviewed with patient? Yes  SHORT TERM GOALS: Target date: 08/01/2022 (Remove Blue Hyperlink)  Patient will be independent with initial  HEP. Baseline:  Goal status: INITIAL  2.  BERG competed Baseline:  Goal status: INITIAL   LONG TERM GOALS: Target date: 08/29/2022 (Remove Blue Hyperlink)  Patient will be independent with advanced/ongoing HEP to improve outcomes and carryover.  Baseline:  Goal status: INITIAL  2.  Patient will report at least 75% improvement in L foot pain to improve QOL. Baseline:  Goal status: INITIAL  3.  Patient will demonstrate improved great toe  and ankle AROM to [facility] to allow for normal gait and stair mechanics. Baseline:  Goal status: INITIAL  4.  Patient will demonstrate improved L ankle strength to 5/5 to normalize gait and balance. Baseline:  Goal status: INITIAL  5.  Patient will be able to ambulate 600' with LRAD and normal gait pattern without increased foot/ankle pain to access community and walk her dog.  Baseline:  Goal status: INITIAL  6. Patient will be able to ascend/descend stairs with 1 HR and reciprocal step pattern safely to access community.  Baseline:  Goal status: INITIAL  7.  Patient will report >= 31 on LEFS to demonstrate improved functional ability. Baseline: 22 / 80 = 27.5 % Goal status: INITIAL  8.  Patient will demonstrate >= 8 point improvement on the BERG to decrease risk of falls. Baseline: TBD Goal status: INITIAL     PLAN:  PT FREQUENCY: 2x/week  PT DURATION: 6 weeks  PLANNED INTERVENTIONS: Therapeutic exercises, Therapeutic activity, Neuromuscular re-education, Balance training, Gait training, Patient/Family education, Self Care, Joint mobilization, Stair training, Aquatic Therapy, Dry Needling, Electrical  stimulation, Cryotherapy, Moist heat, scar mobilization, Taping, Vasopneumatic device, Ionotophoresis '4mg'$ /ml Dexamethasone, and Manual therapy  PLAN FOR NEXT SESSION: Complete BERG, review HEP and progress as indicated, work on great toe ROM, ankle DF, gait/balance, LE strength   Tony Granquist, PT 07/24/2022, 7:00 PM

## 2022-07-25 ENCOUNTER — Encounter: Payer: Self-pay | Admitting: Physical Therapy

## 2022-07-25 ENCOUNTER — Ambulatory Visit: Payer: Self-pay | Admitting: Physical Therapy

## 2022-07-25 DIAGNOSIS — M6281 Muscle weakness (generalized): Secondary | ICD-10-CM

## 2022-07-25 DIAGNOSIS — R2689 Other abnormalities of gait and mobility: Secondary | ICD-10-CM

## 2022-07-25 DIAGNOSIS — R600 Localized edema: Secondary | ICD-10-CM

## 2022-07-25 DIAGNOSIS — M25675 Stiffness of left foot, not elsewhere classified: Secondary | ICD-10-CM

## 2022-07-25 DIAGNOSIS — M25572 Pain in left ankle and joints of left foot: Secondary | ICD-10-CM

## 2022-07-25 IMAGING — MG MM DIGITAL SCREENING BILAT W/ TOMO AND CAD
6 of 10 series · 6 of 30 positions shown · non-contrast
Comparison: Previous exam(s).

CLINICAL DATA: Screening.

EXAM:
DIGITAL SCREENING BILATERAL MAMMOGRAM WITH TOMOSYNTHESIS AND CAD
TECHNIQUE: Bilateral screening digital craniocaudal and mediolateral oblique
mammograms were obtained. Bilateral screening digital breast
tomosynthesis was performed. The images were evaluated with
computer-aided detection.

[R CC synth-2D]
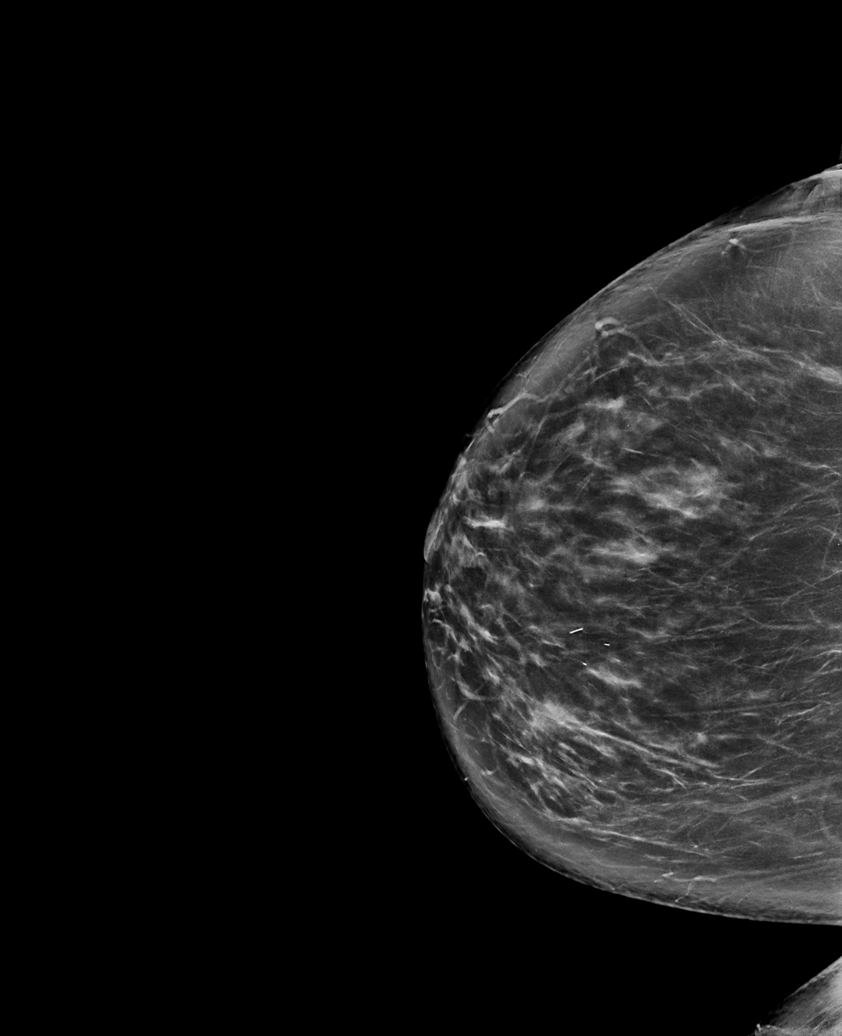

[R MLO synth-2D (1 of 2)]
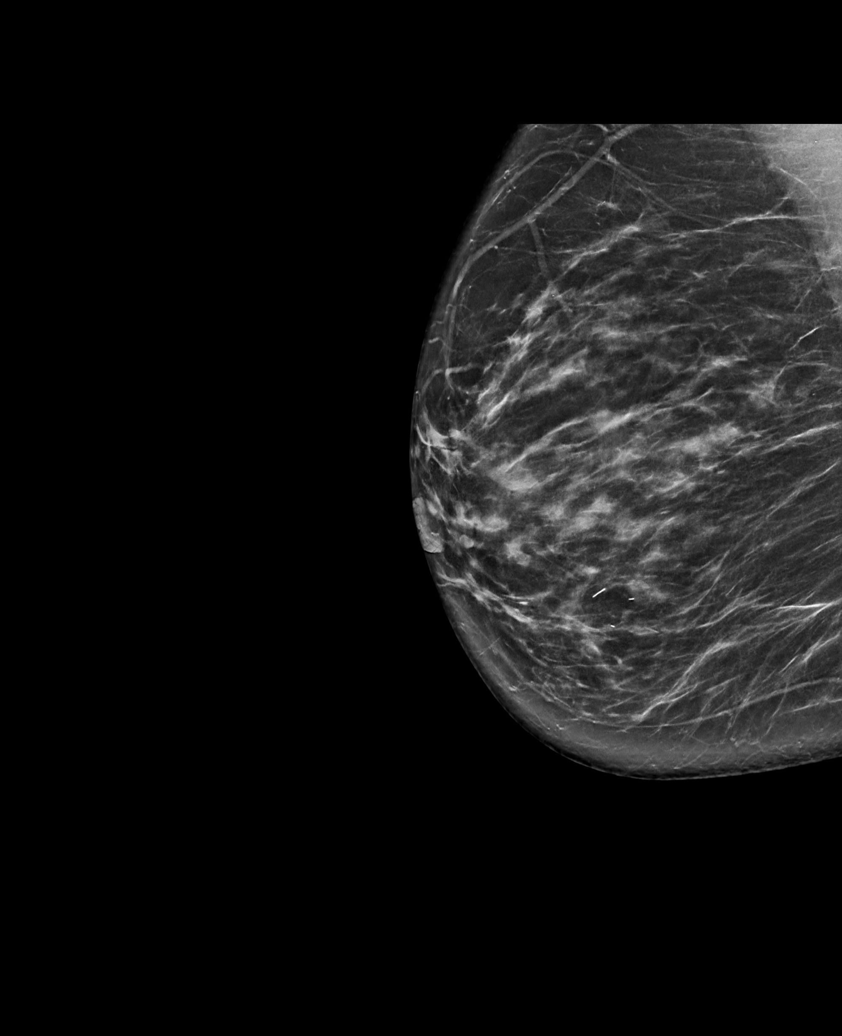

[R MLO synth-2D (2 of 2)]
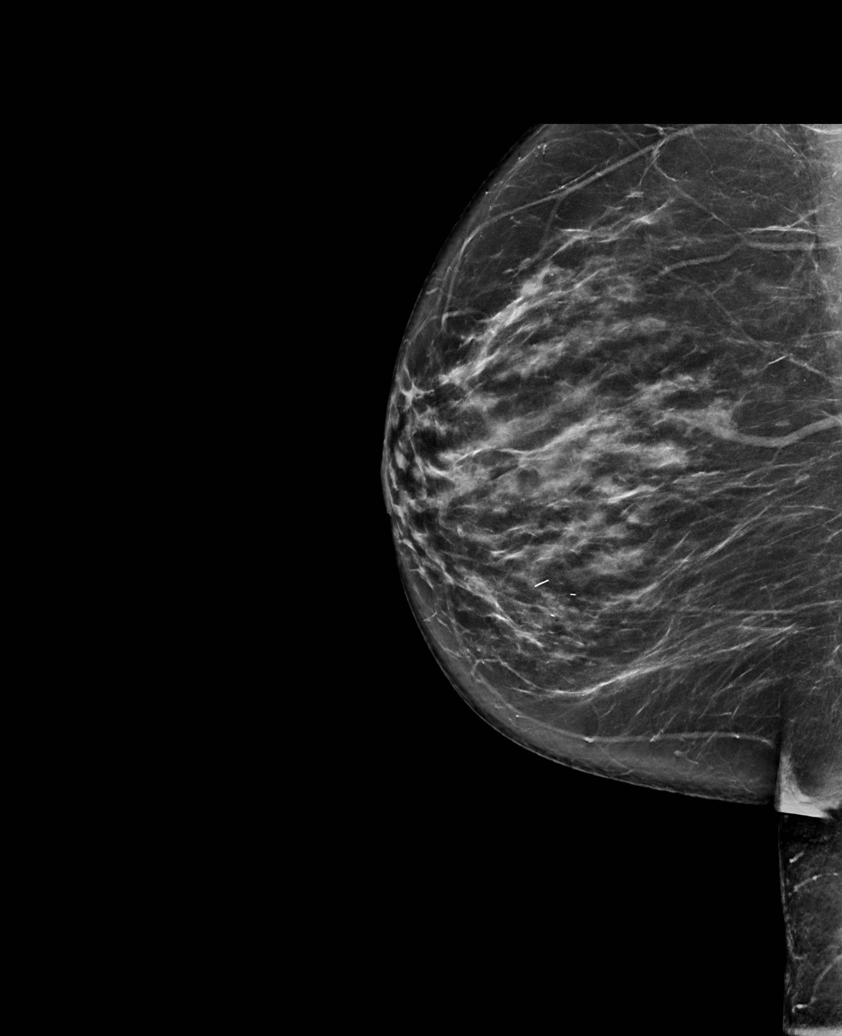

[L CC synth-2D]
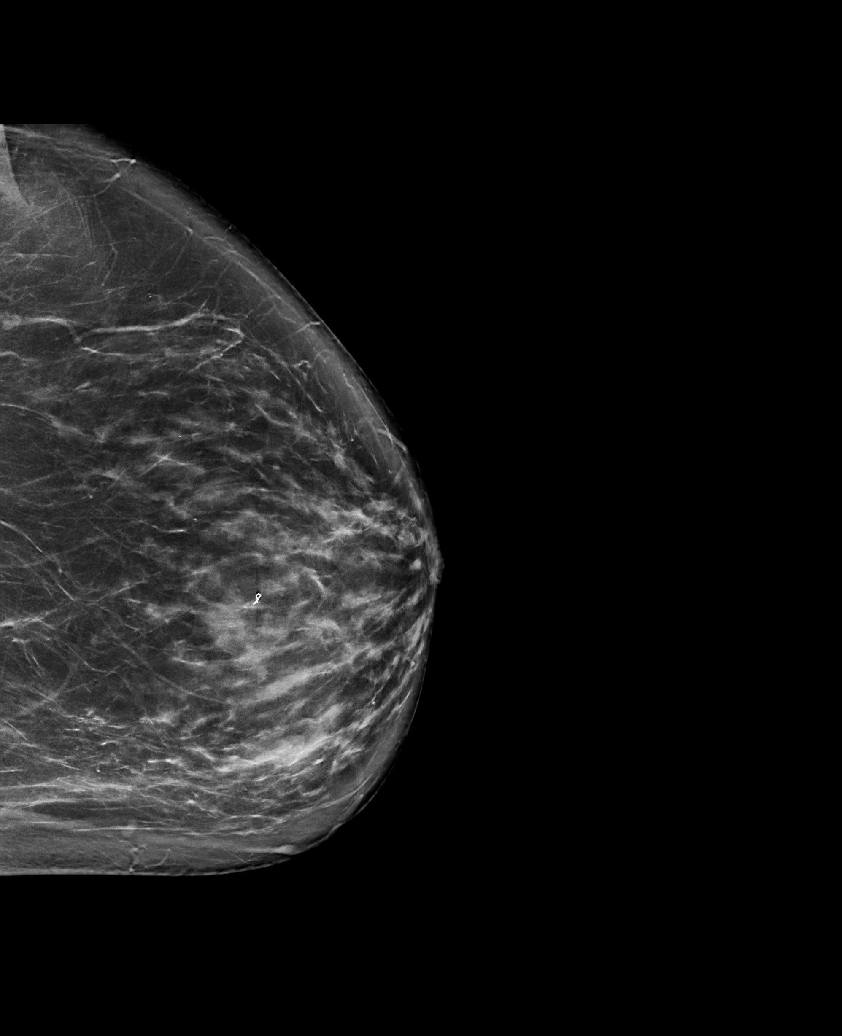

[L MLO synth-2D]
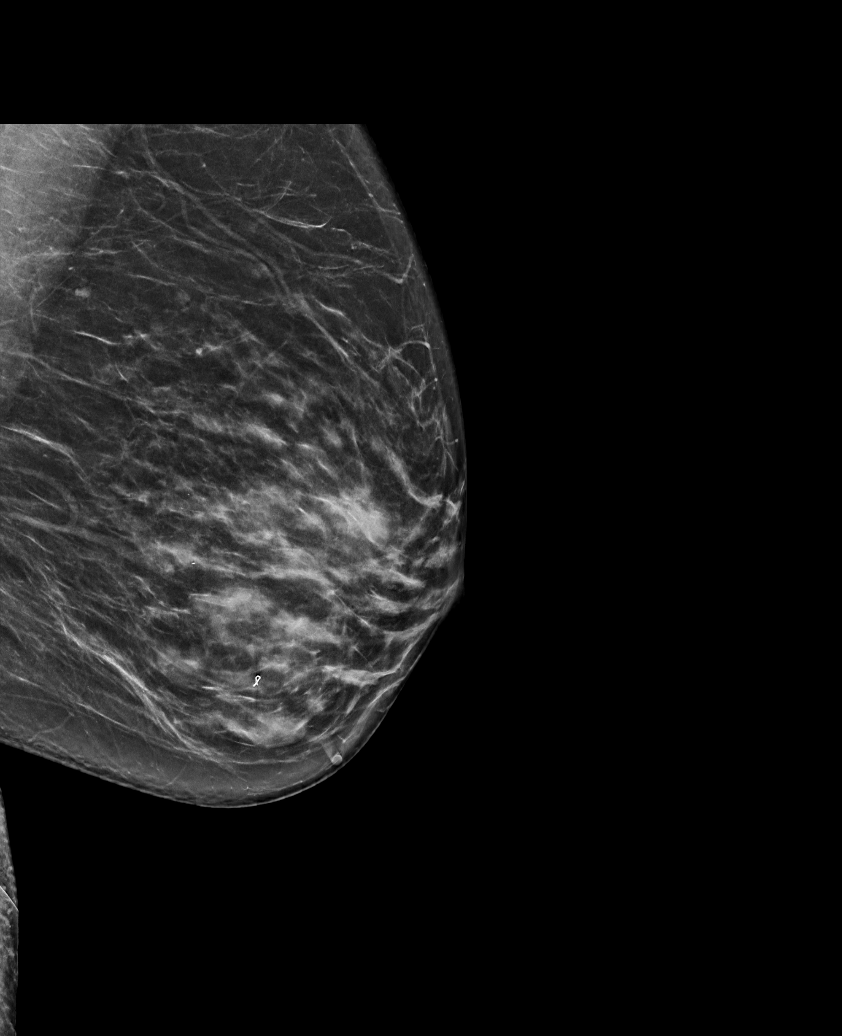

[L MLO tomo · tomo slice 39/78.0]
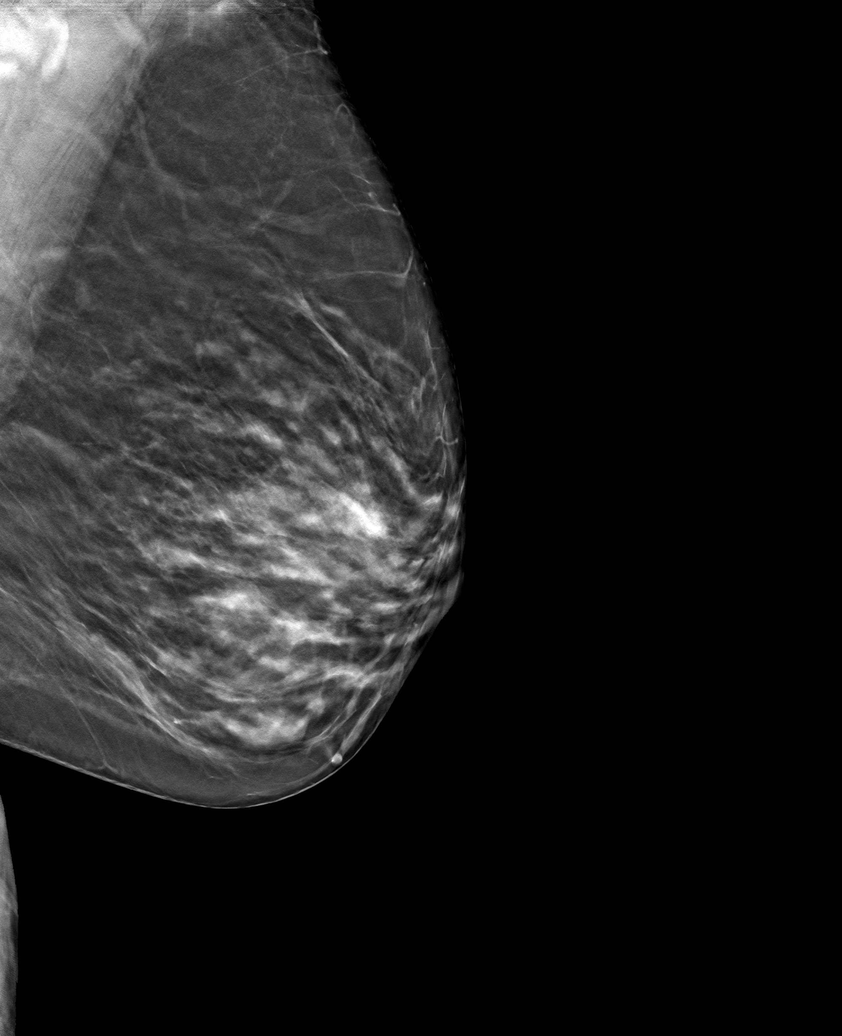

[6 of 30 positions shown; findings below may reference images not displayed]

ACR Breast Density Category c: The breast tissue is heterogeneously
dense, which may obscure small masses.
FINDINGS: There are no findings suspicious for malignancy.
IMPRESSION: No mammographic evidence of malignancy. A result letter of this
screening mammogram will be mailed directly to the patient.

RECOMMENDATION:
Screening mammogram in one year. (Code:[id number]-[id number])

BI-RADS CATEGORY  1: Negative.

## 2022-07-25 NOTE — Therapy (Unsigned)
OUTPATIENT PHYSICAL THERAPY TREATMENT   Patient Name: Casey Frazier MRN: [id number] DOB:1956-04-23, 66 y.o., female Today's Date: 07/25/2022   END OF SESSION:  PT End of Session - 07/25/22 1402     Visit Number 2    Number of Visits 12    Date for PT Re-Evaluation 08/29/22    Authorization Type BCBS    PT Start Time 1402    PT Stop Time 1448    PT Time Calculation (min) 46 min    Activity Tolerance Patient tolerated treatment well    Behavior During Therapy WFL for tasks assessed/performed              Past Medical History:  Diagnosis Date   Allergy    Anxiety    Colon polyps    Depression    Fibroid    history of uterine fibroids   GERD (gastroesophageal reflux disease)    Hayfever    Headache    migraines   Heart murmur    History of colon polyps    Hypertension    Migraines    Osteopenia    Past Surgical History:  Procedure Laterality Date   ABDOMINAL HYSTERECTOMY  1999   complete   BREAST BIOPSY Left    benign   COLONOSCOPY WITH PROPOFOL N/A 03/24/2017   Procedure: COLONOSCOPY WITH PROPOFOL;  Surgeon: Doran Stabler, MD;  Location: [facility];  Service: Gastroenterology;  Laterality: N/A;   colonscopy     with polyps removed   ESOPHAGOGASTRODUODENOSCOPY (EGD) WITH PROPOFOL N/A 03/24/2017   Procedure: ESOPHAGOGASTRODUODENOSCOPY (EGD) WITH PROPOFOL;  Surgeon: Doran Stabler, MD;  Location: [facility];  Service: Gastroenterology;  Laterality: N/A;   FOOT SURGERY     Patient Active Problem List   Diagnosis Date Noted   Major depressive disorder, severe ([place]) 11/18/2020   Osteopenia    Numbness 03/05/2018   Muscle inflammation 03/23/2017   GAD (generalized anxiety disorder) 03/19/2007   Essential hypertension 03/19/2007   HEADACHE 03/19/2007   DEPRESSION/ANXIETY 01/22/2007    PCP: Shelda Pal, DO  REFERRING PROVIDER: Criselda Peaches, DPM   REFERRING DIAG: M20.12,M21.612 (ICD-10-CM) - Hallux valgus with bunions, left   Z98.890 (ICD-10-CM) - Post-operative state  Patient is status post bunion correction Aug 11 , would like to include ROM of toes, intrinsic muscle strengthening, stability, and scar massage. Modalities PRN at therapist's discretion.   THERAPY DIAG:  Stiffness of left foot, not elsewhere classified  Pain in left ankle and joints of left foot  Muscle weakness (generalized)  Localized edema  Other abnormalities of gait and mobility  RATIONALE FOR EVALUATION AND TREATMENT: Rehabilitation  ONSET DATE: 04/15/22  NEXT MD VISIT: 08/22/22   SUBJECTIVE:   SUBJECTIVE STATEMENT: Pt reports more discomfort and tightness than pain which bothers her when she has been up on her feet a lot.  PERTINENT HISTORY: Anxiety, depression, HTN, osteopenia PAIN:  Are you having pain? Yes: NPRS scale: 0 today up to 7/10 Pain location: left toes Pain description: discomfort Aggravating factors: prolonged standing and walking Relieving factors: rest  PRECAUTIONS: None  WEIGHT BEARING RESTRICTIONS: No  FALLS:  Has patient fallen in last 6 months? Yes. Number of falls 2-3 dog pulled her down a couple of times and fell once the day of surgery due to meds  LIVING ENVIRONMENT: Lives with: lives alone Lives in: House/apartment Stairs: No Has following equipment at home: Single point cane, Environmental consultant - 2 wheeled, and scooter  OCCUPATION: desk job  PLOF: Independent  PATIENT GOALS: get the swelling down so I can where nicer shoes   OBJECTIVE:   DIAGNOSTIC FINDINGS:  07/11/22 XR - healing well  PATIENT SURVEYS:  LEFS  22 / 80 = 27.5 %  COGNITION: Overall cognitive status: Within functional limits for tasks assessed     SENSATION: Reports numbness over metatarsals  EDEMA:  Circumferential: R = 20 cm  L =20.8 cm   MUSCLE LENGTH: Heelcords: see ROM   POSTURE:  hallux valgus bil  PALPATION: Tender along incisions, some numbness over metatarsals  LOWER EXTREMITY ROM:  A/P ROM  Right eval Left eval  Ankle dorsiflexion 2/6 -6/4  Ankle plantarflexion    Ankle inversion    Ankle eversion    Great toe extension MTP 55 46  Great toe flexion MTP 40 15  Great toe ABD 18 (28 deg add to 10 deg adduction) 16 (28 deg add to 12 deg Adduction)       (Blank rows = WFL)  LOWER EXTREMITY MMT:  MMT Right eval Left eval  Hip flexion  4+  Hip extension    Hip abduction    Hip adduction    Knee flexion  4  Knee extension  4+  Ankle dorsiflexion  4+  Ankle plantarflexion  4 sitting  Ankle inversion  4  Ankle eversion  5   (Blank rows = not tested)  GAIT: Distance walked: 60 Assistive device utilized: None Level of assistance: Complete Independence Comments: decreased step length R, decreased heel strike and toe off L  FUNCTIONAL TESTS:  Berg Balance Scale: 54/56 (07/25/22)   TODAY'S TREATMENT:                                                                                                                              DATE:    07/25/22 THERAPEUTIC EXERCISE: to improve flexibility, strength and mobility.  Verbal and tactile cues throughout for technique. NuStep - L4 x 2 min - discontinued due to L knee pain  Rec Bike - L1 x 4 min Ankle circles CW/CCW x 10 L great toe extension stretch 3 x 30 sec L toe yoga 10 x 3" L toe spreading 10 x 3" L seated arch lifts 10 x 5" L towel scrunches Seated L soleus stretch 3 x 30" Seated heel-toe raises 2 x 10 L long-sitting gastroc stretch 3 x 30"  THERAPEUTIC ACTIVITIES: Berg = 54/56  MANUAL THERAPY: To promote improved flexibility, improved joint mobility, increased ROM, and reduced pain. Incisional scar massage  L talocrural A/P and P/A joint mobs to improve ankle DF and PF  GAIT TRAINING: To normalize gait pattern. 180 ft w/o AD - cues for heel strike with heel-toe progression   07/18/22 See Pt ed   PATIENT EDUCATION:  Education details: HEP review, scar massage, and discussed use of  ice/elevation. Person educated: Patient Education method: Explanation, Demonstration, and Verbal cues Education comprehension: verbalized understanding and returned demonstration  HOME EXERCISE PROGRAM: Access Code: [email or web address] URL: [email or web address] Date: 07/18/2022 Prepared by: Almyra Free  Exercises - Seated Ankle Circles  - 2 x daily - 7 x weekly - 2 sets - 10 reps - Seated Self Great Toe Stretch  - 2 x daily - 7 x weekly - 1 sets - 3 reps - 30 sec hold - Toe Yoga - Alternating Great Toe and Lesser Toe Extension (Mirrored)  - 2 x daily - 7 x weekly - 1 sets - 10 reps - Seated Arch Lifts  - 2 x daily - 7 x weekly - 1-2 sets - 10 reps - 5 sec hold - Toe Spreading  - 2 x daily - 7 x weekly - 1-2 sets - 10 reps - Seated Toe Towel Scrunches  - 2 x daily - 7 x weekly - 3 sets - 10 reps - Seated Soleus Stretch  - 2 x daily - 7 x weekly - 1 sets - 3 reps - 30-60 sec hold   ASSESSMENT:  CLINICAL IMPRESSION: Berg testing completed with score of 54/56 indicating lower risk for falls, therefore LTG #8 deferred. Arohi reports continued stiffness and discomfort which worsens when she has been on her feet more. HEP reviewed with cues provided to avoid extremes of painful motion, instead focusing on ROM within tolerance, pushing to slight stretch. Some difficulty isolating great toe and lesser toe motion with toe yoga as well as creating lift during arch lifts - continued practice necessary. Provided instruction in incisional scar massage to reduce adhesions limiting ROM. Introduced minimized weight bearing heel-toe raises with some discomfort noted into DF ROM - better after talocrural joint mobs. Promoted carryover into gait with cues for heel strike and heel-toe progression to normalize gait pattern.  OBJECTIVE IMPAIRMENTS: Abnormal gait, decreased balance, decreased mobility, decreased ROM, decreased strength, increased edema, increased fascial restrictions, increased muscle spasms,  impaired flexibility, postural dysfunction, and pain.   ACTIVITY LIMITATIONS: standing, squatting, stairs, and locomotion level  PARTICIPATION LIMITATIONS: occupation and walking her dog  PERSONAL FACTORS: 3+ comorbidities: Anxiety, depression, HTN, osteopenia  are also affecting patient's functional outcome.   REHAB POTENTIAL: Excellent  CLINICAL DECISION MAKING: Stable/uncomplicated  EVALUATION COMPLEXITY: Low   GOALS: Goals reviewed with patient? Yes  SHORT TERM GOALS: Target date: 08/01/2022 (Remove Blue Hyperlink)  Patient will be independent with initial HEP. Baseline:  Goal status: IN PROGRESS  2.  BERG competed Baseline:  Goal status: IN PROGRESS   LONG TERM GOALS: Target date: 08/29/2022 (Remove Blue Hyperlink)  Patient will be independent with advanced/ongoing HEP to improve outcomes and carryover.  Baseline:  Goal status: IN PROGRESS  2.  Patient will report at least 75% improvement in L foot pain to improve QOL. Baseline:  Goal status: IN PROGRESS  3.  Patient will demonstrate improved great toe  and ankle AROM to [facility] to allow for normal gait and stair mechanics. Baseline:  Goal status: IN PROGRESS  4.  Patient will demonstrate improved L ankle strength to 5/5 to normalize gait and balance. Baseline:  Goal status: IN PROGRESS  5.  Patient will be able to ambulate 600' with LRAD and normal gait pattern without increased foot/ankle pain to access community and walk her dog.  Baseline:  Goal status: IN PROGRESS  6. Patient will be able to ascend/descend stairs with 1 HR and reciprocal step pattern safely to access community.  Baseline:  Goal status: IN PROGRESS  7.  Patient will report >= 31 on LEFS to demonstrate improved functional ability.  Baseline: 22 / 80 = 27.5 % Goal status: IN PROGRESS  8.  Patient will demonstrate >/= 8 point improvement on the BERG to decrease risk of falls. Baseline: 54/56 Goal status: DEFERRED due high baseline score     PLAN:  PT FREQUENCY: 2x/week  PT DURATION: 6 weeks  PLANNED INTERVENTIONS: Therapeutic exercises, Therapeutic activity, Neuromuscular re-education, Balance training, Gait training, Patient/Family education, Self Care, Joint mobilization, Stair training, Aquatic Therapy, Dry Needling, Electrical stimulation, Cryotherapy, Moist heat, scar mobilization, Taping, Vasopneumatic device, Ionotophoresis '4mg'$ /ml Dexamethasone, and Manual therapy  PLAN FOR NEXT SESSION: work on great toe ROM, ankle DF, gait/balance, LE strength; review HEP and progress as indicated    Percival Spanish, PT 07/25/2022, 3:17 PM

## 2022-08-02 ENCOUNTER — Ambulatory Visit: Payer: Self-pay

## 2022-08-02 DIAGNOSIS — M25572 Pain in left ankle and joints of left foot: Secondary | ICD-10-CM

## 2022-08-02 DIAGNOSIS — M25675 Stiffness of left foot, not elsewhere classified: Secondary | ICD-10-CM

## 2022-08-02 DIAGNOSIS — R2689 Other abnormalities of gait and mobility: Secondary | ICD-10-CM

## 2022-08-02 DIAGNOSIS — M6281 Muscle weakness (generalized): Secondary | ICD-10-CM

## 2022-08-02 DIAGNOSIS — R600 Localized edema: Secondary | ICD-10-CM

## 2022-08-02 NOTE — Therapy (Addendum)
OUTPATIENT PHYSICAL THERAPY TREATMENT   Patient Name: Casey Frazier MRN: [id number] DOB:1956/03/20, 66 y.o., female Today's Date: 08/02/2022   END OF SESSION:  PT End of Session - 08/02/22 1528     Visit Number 3    Number of Visits 12    Date for PT Re-Evaluation 08/29/22    Authorization Type BCBS    PT Start Time 1524    PT Stop Time 1610    PT Time Calculation (min) 46 min    Activity Tolerance Patient tolerated treatment well    Behavior During Therapy WFL for tasks assessed/performed               Past Medical History:  Diagnosis Date   Allergy    Anxiety    Colon polyps    Depression    Fibroid    history of uterine fibroids   GERD (gastroesophageal reflux disease)    Hayfever    Headache    migraines   Heart murmur    History of colon polyps    Hypertension    Migraines    Osteopenia    Past Surgical History:  Procedure Laterality Date   ABDOMINAL HYSTERECTOMY  1999   complete   BREAST BIOPSY Left    benign   COLONOSCOPY WITH PROPOFOL N/A 03/24/2017   Procedure: COLONOSCOPY WITH PROPOFOL;  Surgeon: Doran Stabler, MD;  Location: [facility];  Service: Gastroenterology;  Laterality: N/A;   colonscopy     with polyps removed   ESOPHAGOGASTRODUODENOSCOPY (EGD) WITH PROPOFOL N/A 03/24/2017   Procedure: ESOPHAGOGASTRODUODENOSCOPY (EGD) WITH PROPOFOL;  Surgeon: Doran Stabler, MD;  Location: [facility];  Service: Gastroenterology;  Laterality: N/A;   FOOT SURGERY     Patient Active Problem List   Diagnosis Date Noted   Major depressive disorder, severe ([place]) 11/18/2020   Osteopenia    Numbness 03/05/2018   Muscle inflammation 03/23/2017   GAD (generalized anxiety disorder) 03/19/2007   Essential hypertension 03/19/2007   HEADACHE 03/19/2007   DEPRESSION/ANXIETY 01/22/2007    PCP: Shelda Pal, DO  REFERRING PROVIDER: Criselda Peaches, DPM   REFERRING DIAG: M20.12,M21.612 (ICD-10-CM) - Hallux valgus with bunions,  left  Z98.890 (ICD-10-CM) - Post-operative state  Patient is status post bunion correction Aug 11 , would like to include ROM of toes, intrinsic muscle strengthening, stability, and scar massage. Modalities PRN at therapist's discretion.   THERAPY DIAG:  Stiffness of left foot, not elsewhere classified  Pain in left ankle and joints of left foot  Muscle weakness (generalized)  Localized edema  Other abnormalities of gait and mobility  RATIONALE FOR EVALUATION AND TREATMENT: Rehabilitation  ONSET DATE: 04/15/22  NEXT MD VISIT: 08/22/22   SUBJECTIVE:   SUBJECTIVE STATEMENT: Pt reports tightness and swelling on the top of the foot and radiating to the toes.  PERTINENT HISTORY: Anxiety, depression, HTN, osteopenia PAIN:  Are you having pain? Yes: NPRS scale: 0 /10 Pain location: N/A Pain description: N/A Aggravating factors: prolonged standing and walking Relieving factors: rest  PRECAUTIONS: None  WEIGHT BEARING RESTRICTIONS: No  FALLS:  Has patient fallen in last 6 months? Yes. Number of falls 2-3 dog pulled her down a couple of times and fell once the day of surgery due to meds  LIVING ENVIRONMENT: Lives with: lives alone Lives in: House/apartment Stairs: No Has following equipment at home: Single point cane, Environmental consultant - 2 wheeled, and scooter  OCCUPATION: desk job  PLOF: Independent  PATIENT GOALS: get the swelling  down so I can where nicer shoes   OBJECTIVE:   DIAGNOSTIC FINDINGS:  07/11/22 XR - healing well  PATIENT SURVEYS:  LEFS  22 / 80 = 27.5 %  COGNITION: Overall cognitive status: Within functional limits for tasks assessed     SENSATION: Reports numbness over metatarsals  EDEMA:  Circumferential: R = 20 cm  L =20.8 cm   MUSCLE LENGTH: Heelcords: see ROM   POSTURE:  hallux valgus bil  PALPATION: Tender along incisions, some numbness over metatarsals  LOWER EXTREMITY ROM:  A/P ROM Right eval Left eval  Ankle dorsiflexion 2/6 -6/4   Ankle plantarflexion    Ankle inversion    Ankle eversion    Great toe extension MTP 55 46  Great toe flexion MTP 40 15  Great toe ABD 18 (28 deg add to 10 deg adduction) 16 (28 deg add to 12 deg Adduction)       (Blank rows = WFL)  LOWER EXTREMITY MMT:  MMT Right eval Left eval  Hip flexion  4+  Hip extension    Hip abduction    Hip adduction    Knee flexion  4  Knee extension  4+  Ankle dorsiflexion  4+  Ankle plantarflexion  4 sitting  Ankle inversion  4  Ankle eversion  5   (Blank rows = not tested)  GAIT: Distance walked: 60 Assistive device utilized: None Level of assistance: Complete Independence Comments: decreased step length R, decreased heel strike and toe off L  FUNCTIONAL TESTS:  Berg Balance Scale: 54/56 (07/25/22)   TODAY'S TREATMENT:                                                                                                                              DATE:   08/02/22 THERAPEUTIC EXERCISE: to improve flexibility, strength and mobility.  Verbal and tactile cues throughout for technique. Recumbent Bike L1x62mn Toe yoga alt x 10  Toe curls RTB 2x10 Ankle PF, EV, DF RTB x 10 each Seated arch lifts x 12  Seated towel scrunch with 1lb weight on towel x 10 Manual Therapy: Scar tissue mobilization, retro grade massage for edema  07/25/22 THERAPEUTIC EXERCISE: to improve flexibility, strength and mobility.  Verbal and tactile cues throughout for technique. NuStep - L4 x 2 min - discontinued due to L knee pain  Rec Bike - L1 x 4 min Ankle circles CW/CCW x 10 L great toe extension stretch 3 x 30 sec L toe yoga 10 x 3" L toe spreading 10 x 3" L seated arch lifts 10 x 5" L towel scrunches Seated L soleus stretch 3 x 30" Seated heel-toe raises 2 x 10 L long-sitting gastroc stretch 3 x 30"  THERAPEUTIC ACTIVITIES: Berg = 54/56  MANUAL THERAPY: To promote improved flexibility, improved joint mobility, increased ROM, and reduced  pain. Incisional scar massage  L talocrural A/P and P/A joint mobs to improve ankle DF and PF  GAIT TRAINING: To  normalize gait pattern. 180 ft w/o AD - cues for heel strike with heel-toe progression   07/18/22 See Pt ed   PATIENT EDUCATION:  Education details: HEP review, scar massage, and discussed use of ice/elevation. Person educated: Patient Education method: Explanation, Demonstration, and Verbal cues Education comprehension: verbalized understanding and returned demonstration   HOME EXERCISE PROGRAM: Access Code: [email or web address] URL: [email or web address] Date: 07/18/2022 Prepared by: Almyra Free  Exercises - Seated Ankle Circles  - 2 x daily - 7 x weekly - 2 sets - 10 reps - Seated Self Great Toe Stretch  - 2 x daily - 7 x weekly - 1 sets - 3 reps - 30 sec hold - Toe Yoga - Alternating Great Toe and Lesser Toe Extension (Mirrored)  - 2 x daily - 7 x weekly - 1 sets - 10 reps - Seated Arch Lifts  - 2 x daily - 7 x weekly - 1-2 sets - 10 reps - 5 sec hold - Toe Spreading  - 2 x daily - 7 x weekly - 1-2 sets - 10 reps - Seated Toe Towel Scrunches  - 2 x daily - 7 x weekly - 3 sets - 10 reps - Seated Soleus Stretch  - 2 x daily - 7 x weekly - 1 sets - 3 reps - 30-60 sec hold   ASSESSMENT:  CLINICAL IMPRESSION: Focus of skilled intervention went toward progressing ankle and foot strengthening to improve gait and standing tolerance. Went over scar tissue mobilization again and had patient return demo. She had no increased pain with any interventions but had constant c/o tightness along her toes. She expressed concerns about insurance, stating that her job has cut off her primary insurance coverage and may end therapy early d/t this.  OBJECTIVE IMPAIRMENTS: Abnormal gait, decreased balance, decreased mobility, decreased ROM, decreased strength, increased edema, increased fascial restrictions, increased muscle spasms, impaired flexibility, postural dysfunction, and pain.    ACTIVITY LIMITATIONS: standing, squatting, stairs, and locomotion level  PARTICIPATION LIMITATIONS: occupation and walking her dog  PERSONAL FACTORS: 3+ comorbidities: Anxiety, depression, HTN, osteopenia  are also affecting patient's functional outcome.   REHAB POTENTIAL: Excellent  CLINICAL DECISION MAKING: Stable/uncomplicated  EVALUATION COMPLEXITY: Low   GOALS: Goals reviewed with patient? Yes  SHORT TERM GOALS: Target date: 08/01/2022 (Remove Blue Hyperlink)  Patient will be independent with initial HEP. Baseline:  Goal status: MET - 08/02/22  2.  BERG competed Baseline:  Goal status: MET- met last visit (07/25/22)   LONG TERM GOALS: Target date: 08/29/2022 (Remove Blue Hyperlink)  Patient will be independent with advanced/ongoing HEP to improve outcomes and carryover.  Baseline:  Goal status: IN PROGRESS  2.  Patient will report at least 75% improvement in L foot pain to improve QOL. Baseline:  Goal status: IN PROGRESS  3.  Patient will demonstrate improved great toe  and ankle AROM to [facility] to allow for normal gait and stair mechanics. Baseline:  Goal status: IN PROGRESS  4.  Patient will demonstrate improved L ankle strength to 5/5 to normalize gait and balance. Baseline:  Goal status: IN PROGRESS  5.  Patient will be able to ambulate 600' with LRAD and normal gait pattern without increased foot/ankle pain to access community and walk her dog.  Baseline:  Goal status: IN PROGRESS  6. Patient will be able to ascend/descend stairs with 1 HR and reciprocal step pattern safely to access community.  Baseline:  Goal status: IN PROGRESS  7.  Patient will report >= 31 on LEFS to  demonstrate improved functional ability. Baseline: 22 / 80 = 27.5 % Goal status: IN PROGRESS  8.  Patient will demonstrate >/= 8 point improvement on the BERG to decrease risk of falls. Baseline: 54/56 Goal status: DEFERRED due high baseline score    PLAN:  PT FREQUENCY:  2x/week  PT DURATION: 6 weeks  PLANNED INTERVENTIONS: Therapeutic exercises, Therapeutic activity, Neuromuscular re-education, Balance training, Gait training, Patient/Family education, Self Care, Joint mobilization, Stair training, Aquatic Therapy, Dry Needling, Electrical stimulation, Cryotherapy, Moist heat, scar mobilization, Taping, Vasopneumatic device, Ionotophoresis 5m/ml Dexamethasone, and Manual therapy  PLAN FOR NEXT SESSION: work on great toe ROM, ankle DF, gait/balance, LE strength; review HEP and progress as indicated    BArtist Pais PTA 08/02/2022, 4:15 PM  PHYSICAL THERAPY DISCHARGE SUMMARY  Visits from Start of Care: 3  Current functional level related to goals / functional outcomes: See above, met short term goals.    Remaining deficits: Tightness along toes   Education / Equipment: HEP  Plan: Patient agrees to discharge.  Patient goals were not met. Patient is being discharged by request after being released by her MD.    ERennie Natter PT, DPT 8:38 AM 09/12/2022

## 2022-08-08 ENCOUNTER — Ambulatory Visit: Payer: Self-pay

## 2022-08-10 ENCOUNTER — Telehealth: Payer: Self-pay | Admitting: Podiatry

## 2022-08-10 NOTE — Telephone Encounter (Signed)
Patient was scheduled to return to work on 12/4, but she has not returned to work. Mrs. Gupton said that she is having continued swelling and is scheduled to see you on 12/18 and would like to be extended out of work.

## 2022-08-11 ENCOUNTER — Ambulatory Visit: Payer: Self-pay | Attending: Podiatry | Admitting: Physical Therapy

## 2022-08-15 ENCOUNTER — Ambulatory Visit: Payer: Self-pay | Admitting: Physical Therapy

## 2022-08-22 ENCOUNTER — Ambulatory Visit: Payer: Medicare Other | Admitting: Podiatry

## 2022-08-24 ENCOUNTER — Other Ambulatory Visit: Payer: Self-pay | Admitting: Family Medicine

## 2022-08-24 DIAGNOSIS — F341 Dysthymic disorder: Secondary | ICD-10-CM

## 2022-09-18 ENCOUNTER — Other Ambulatory Visit: Payer: Self-pay | Admitting: Family Medicine

## 2022-09-18 DIAGNOSIS — I10 Essential (primary) hypertension: Secondary | ICD-10-CM

## 2022-10-07 ENCOUNTER — Encounter: Payer: Self-pay | Admitting: Family Medicine

## 2022-10-07 ENCOUNTER — Ambulatory Visit (INDEPENDENT_AMBULATORY_CARE_PROVIDER_SITE_OTHER): Payer: Self-pay | Admitting: Family Medicine

## 2022-10-07 VITALS — BP 120/78 | HR 67 | Temp 98.7°F | Ht 62.0 in | Wt 136.4 lb

## 2022-10-07 DIAGNOSIS — J069 Acute upper respiratory infection, unspecified: Secondary | ICD-10-CM

## 2022-10-07 MED ORDER — METHYLPREDNISOLONE 4 MG PO TBPK: ORAL_TABLET | ORAL | 0 refills | Status: DC

## 2022-10-07 NOTE — Progress Notes (Signed)
Chief Complaint  Patient presents with   congestion    Casey Frazier here for URI complaints.  Duration: 1 month, initially started w fever 101 F, chils, fatigue for a few days.  2 weeks ago started having s/s's again.  Associated symptoms: rhinorrhea, itchy watery eyes, ear pain, scratchy throat, and coughing, drainage Denies: sinus congestion, sinus pain, itchy watery eyes, ear drainage, sore throat, wheezing, shortness of breath, myalgia, and fevers, N/V/D Treatment to date: Tylenol, tea Sick contacts: No  Past Medical History:  Diagnosis Date   Allergy    Anxiety    Colon polyps    Depression    Fibroid    history of uterine fibroids   GERD (gastroesophageal reflux disease)    Hayfever    Headache    migraines   Heart murmur    History of colon polyps    Hypertension    Migraines    Osteopenia     Objective BP 120/78 (BP Location: Left Arm, Patient Position: Sitting, Cuff Size: Normal)   Pulse 67   Temp 98.7 F (37.1 C) (Oral)   Ht '5\' 2"'$  (1.575 m)   Wt 136 lb 6 oz (61.9 kg)   SpO2 98%   BMI 24.94 kg/m  General: Awake, alert, appears stated age HEENT: AT, [place], ears patent b/l and TM's neg, nares patent w/o discharge, pharynx pink and without exudates, MMM Neck: No masses or asymmetry Heart: RRR Lungs: CTAB, no accessory muscle use Psych: Age appropriate judgment and insight, normal mood and affect  Viral URI with cough - Plan: methylPREDNISolone (MEDROL DOSEPAK) 4 MG TBPK tablet, COVID-19, Flu A+B and RSV  Medrol dosepak for ears. Swab a above. If neg and no improvement, will tx for bacterial bronchitis/walking PNA. Continue to push fluids, practice good hand hygiene, cover mouth when coughing. F/u prn. If starting to experience fevers, shaking, or shortness of breath, seek immediate care. Pt voiced understanding and agreement to the plan.  [place], DO 10/07/22 2:25 PM

## 2022-10-07 NOTE — Patient Instructions (Addendum)
Continue to push fluids, practice good hand hygiene, and cover your mouth if you cough.  If you start having fevers, shaking or shortness of breath, seek immediate care.  Send me a message in 2 d if no better.   Take 2000 u of Vit D3. You can take up to 1200 mg of calcium daily.   Let us know if you need anything.

## 2022-10-08 LAB — COVID-19, FLU A+B AND RSV
Influenza A, NAA: NOT DETECTED
Influenza B, NAA: NOT DETECTED
RSV, NAA: NOT DETECTED
SARS-CoV-2, NAA: NOT DETECTED

## 2022-10-13 ENCOUNTER — Ambulatory Visit (INDEPENDENT_AMBULATORY_CARE_PROVIDER_SITE_OTHER): Payer: Medicare Other

## 2022-10-13 ENCOUNTER — Ambulatory Visit (INDEPENDENT_AMBULATORY_CARE_PROVIDER_SITE_OTHER): Payer: Medicare Other | Admitting: Podiatry

## 2022-10-13 DIAGNOSIS — M21612 Bunion of left foot: Secondary | ICD-10-CM

## 2022-10-13 DIAGNOSIS — M2012 Hallux valgus (acquired), left foot: Secondary | ICD-10-CM

## 2022-10-16 NOTE — Progress Notes (Signed)
  Subjective:  Patient ID: Casey Frazier, female    DOB: Dec 18, 1955,  MRN: [id number]  Chief Complaint  Patient presents with   Bunions    Follow up left foot status post bunion surgery     67 y.o. female returns for post-op check.  She is back in a regular shoe.  Has some swelling at the end of the day but does not really hurt  Review of Systems: Negative except as noted in the HPI. Denies N/V/F/Ch.   Objective:  There were no vitals filed for this visit. There is no height or weight on file to calculate BMI. Constitutional Well developed. Well nourished.  Vascular Foot warm and well perfused. Capillary refill normal to all digits.  Calf is soft and supple, no posterior calf or knee pain, negative Homans' sign  Neurologic Normal speech. Oriented to person, place, and time. Epicritic sensation to light touch grossly present bilaterally.  Dermatologic Incisions are well-healed and not hypertrophic  Orthopedic: She has no pain to palpation.  Good range of motion of the toe.   Multiple view plain film radiographs: New films taken today show complete consolidation across the Lapidus site and callus formation across the metatarsal osteotomies Assessment:   1. Hallux valgus with bunions, left    Plan:  Patient was evaluated and treated and all questions answered.  S/p foot surgery left -Continues to improve.  We reviewed her x-rays.  I have no further recommendations or restrictions for her.  She may return to work.  Return to see me if it worsens or does not improve  Return if symptoms worsen or fail to improve.

## 2022-10-25 ENCOUNTER — Ambulatory Visit: Payer: Medicare Other | Admitting: Podiatry

## 2022-11-20 ENCOUNTER — Other Ambulatory Visit: Payer: Self-pay | Admitting: Family Medicine

## 2022-11-20 DIAGNOSIS — F341 Dysthymic disorder: Secondary | ICD-10-CM

## 2022-12-05 ENCOUNTER — Other Ambulatory Visit: Payer: Self-pay | Admitting: Family Medicine

## 2022-12-05 DIAGNOSIS — Z1231 Encounter for screening mammogram for malignant neoplasm of breast: Secondary | ICD-10-CM

## 2022-12-17 ENCOUNTER — Other Ambulatory Visit: Payer: Self-pay | Admitting: Family Medicine

## 2022-12-17 DIAGNOSIS — I10 Essential (primary) hypertension: Secondary | ICD-10-CM

## 2022-12-26 ENCOUNTER — Other Ambulatory Visit: Payer: Self-pay | Admitting: Family Medicine

## 2022-12-26 DIAGNOSIS — I10 Essential (primary) hypertension: Secondary | ICD-10-CM

## 2022-12-26 MED ORDER — AMLODIPINE BESYLATE 5 MG PO TABS: 5.0000 mg | ORAL_TABLET | Freq: Every day | ORAL | 0 refills | Status: DC

## 2022-12-26 NOTE — Telephone Encounter (Signed)
Pt called stating that she is completely out of the following medication and she was looking to have it filled today.

## 2022-12-27 DIAGNOSIS — H2513 Age-related nuclear cataract, bilateral: Secondary | ICD-10-CM | POA: Diagnosis not present

## 2023-01-05 ENCOUNTER — Ambulatory Visit
Admission: RE | Admit: 2023-01-05 | Discharge: 2023-01-05 | Disposition: A | Discharge status: Home / Self Care | Payer: PPO | Source: Ambulatory Visit | Attending: Family Medicine | Admitting: Family Medicine

## 2023-01-05 DIAGNOSIS — Z1231 Encounter for screening mammogram for malignant neoplasm of breast: Secondary | ICD-10-CM | POA: Diagnosis not present

## 2023-01-10 ENCOUNTER — Ambulatory Visit (INDEPENDENT_AMBULATORY_CARE_PROVIDER_SITE_OTHER): Payer: PPO | Admitting: Family Medicine

## 2023-01-10 ENCOUNTER — Encounter: Payer: Self-pay | Admitting: Family Medicine

## 2023-01-10 VITALS — BP 111/71 | HR 73 | Temp 98.8°F | Ht 62.0 in | Wt 126.5 lb

## 2023-01-10 DIAGNOSIS — M79641 Pain in right hand: Secondary | ICD-10-CM

## 2023-01-10 DIAGNOSIS — I10 Essential (primary) hypertension: Secondary | ICD-10-CM | POA: Diagnosis not present

## 2023-01-10 DIAGNOSIS — Z0001 Encounter for general adult medical examination with abnormal findings: Secondary | ICD-10-CM

## 2023-01-10 DIAGNOSIS — Z1283 Encounter for screening for malignant neoplasm of skin: Secondary | ICD-10-CM

## 2023-01-10 DIAGNOSIS — M79642 Pain in left hand: Secondary | ICD-10-CM | POA: Diagnosis not present

## 2023-01-10 DIAGNOSIS — Z13220 Encounter for screening for lipoid disorders: Secondary | ICD-10-CM

## 2023-01-10 DIAGNOSIS — Z0000 Encounter for general adult medical examination without abnormal findings: Secondary | ICD-10-CM

## 2023-01-10 DIAGNOSIS — M85852 Other specified disorders of bone density and structure, left thigh: Secondary | ICD-10-CM | POA: Diagnosis not present

## 2023-01-10 MED ORDER — AMLODIPINE BESYLATE 5 MG PO TABS: 5.0000 mg | ORAL_TABLET | Freq: Every day | ORAL | 3 refills | Status: DC

## 2023-01-10 NOTE — Progress Notes (Signed)
Chief Complaint  Patient presents with   Annual Exam    90 day refill for Amlodipine today     Well Woman Casey Frazier is here for a complete physical.   Her last physical was >1 year ago.  Current diet: in general, diet could be better. Current exercise: walking. Weight is stable and she denies daytime fatigue. Seatbelt? Yes Advanced directive? No  Health Maintenance Colonoscopy- Yes Shingrix- Yes DEXA-due Mammogram- Yes Tetanus- Yes Pneumonia- Yes Hep C screen- Yes  Over the past 2 weeks, she has had pain in both of her hands.  She has some intermittent numbness over the tips.  She does not have any weakness or dropping things.  No inciting trigger or injury/trauma.  Has not tried anything at home so far.  Past Medical History:  Diagnosis Date   Allergy    Anxiety    Colon polyps    Depression    Fibroid    history of uterine fibroids   GERD (gastroesophageal reflux disease)    Hayfever    Headache    migraines   Heart murmur    History of colon polyps    Hypertension    Migraines    Osteopenia      Past Surgical History:  Procedure Laterality Date   ABDOMINAL HYSTERECTOMY  1999   complete   BREAST BIOPSY Left    benign   COLONOSCOPY WITH PROPOFOL N/A 03/24/2017   Procedure: COLONOSCOPY WITH PROPOFOL;  Surgeon: Sherrilyn Rist, MD;  Location: [facility];  Service: Gastroenterology;  Laterality: N/A;   colonscopy     with polyps removed   ESOPHAGOGASTRODUODENOSCOPY (EGD) WITH PROPOFOL N/A 03/24/2017   Procedure: ESOPHAGOGASTRODUODENOSCOPY (EGD) WITH PROPOFOL;  Surgeon: Sherrilyn Rist, MD;  Location: [facility];  Service: Gastroenterology;  Laterality: N/A;   FOOT SURGERY      Medications  Current Outpatient Medications on File Prior to Visit  Medication Sig Dispense Refill   diphenhydrAMINE (BENADRYL) 25 MG tablet Take 25 mg by mouth daily as needed for allergies.     lisinopril (ZESTRIL) 40 MG tablet TAKE ONE TABLET BY MOUTH ONE TIME  DAILY 90 tablet 0   Multiple Vitamin (MULTIVITAMIN WITH MINERALS) TABS tablet Take 1 tablet by mouth daily.     venlafaxine XR (EFFEXOR-XR) 75 MG 24 hr capsule TAKE ONE CAPSULE BY MOUTH DAILY WITH BREAKFAST 90 capsule 0   No current facility-administered medications on file prior to visit.     Allergies No Known Allergies  Review of Systems: Constitutional:  no fevers Eye:  no recent significant change in vision Ears:  No changes in hearing Nose/Mouth/Throat:  no complaints of nasal congestion, no sore throat Cardiovascular: no chest pain Respiratory:  No shortness of breath Gastrointestinal:  No change in bowel habits GU:  Female: negative for dysuria Integumentary:  no abnormal skin lesions reported Neurologic:  no headaches Endocrine:  denies unexplained weight changes  Exam BP 111/71 (BP Location: Left Arm, Patient Position: Sitting, Cuff Size: Normal)   Pulse 73   Temp 98.8 F (37.1 C) (Oral)   Ht 5\' 2"  (1.575 m)   Wt 126 lb 8 oz (57.4 kg)   SpO2 96%   BMI 23.14 kg/m  General:  well developed, well nourished, in no apparent distress Skin:  no significant moles, warts, or growths Head:  no masses, lesions, or tenderness Eyes:  pupils equal and round, sclera anicteric without injection Ears:  canals without lesions, TMs shiny without retraction, no  obvious effusion, no erythema Nose:  nares patent, mucosa normal, and no drainage Throat/Pharynx:  lips and gingiva without lesion; tongue and uvula midline; non-inflamed pharynx; no exudates or postnasal drainage Neck: neck supple without adenopathy, thyromegaly, or masses Lungs:  clear to auscultation, breath sounds equal bilaterally, no respiratory distress Cardio:  regular rate and rhythm, no bruits or LE edema Abdomen:  abdomen soft, nontender; bowel sounds normal; no masses or organomegaly Genital: Deferred Neuro: Grip strength adequate bilaterally, negative Tinel's and Phalen's bilaterally at the wrist, gait normal;  deep tendon reflexes normal and symmetric Psych: well oriented with normal range of affect and appropriate judgment/insight  Assessment and Plan  Well adult exam  Essential hypertension - Plan: Lipid panel, CBC, Comprehensive metabolic panel, amLODipine (NORVASC) 5 MG tablet  Osteopenia of neck of left femur - Plan: DG Bone Density, VITAMIN D 25 Hydroxy (Vit-D Deficiency, Fractures)  Skin cancer screening - Plan: Ambulatory referral to Dermatology  Pain in both hands   Well 67 y.o. female. Counseled on diet and exercise. Advanced directive form provided today.  Vit D and Ca rec'd.  Bilateral hand pain likely related to arthritis rather than carpal tunnel.  Stretches and exercises provided.  Heat, ice, Tylenol.  If no improvement t in he next 2 to 4 weeks, she will let me know and we will refer her to the sports medicine team for further evaluation. Other orders as above. Follow up in 6 mo. The patient voiced understanding and agreement to the plan.  Jilda Roche McGregor, DO 01/10/23 3:10 PM

## 2023-01-10 NOTE — Patient Instructions (Addendum)
Give [place] 2-3 business days to get the results of your labs back.   Keep the diet clean and stay active.  If you do not hear anything about your referral in the next 1-2 weeks, call our office and ask for an update.  Please get me a copy of your advanced directive form at your convenience.   Take 1200 mg of calcium daily and at least 1000 units of vitamin D3 daily.   Send me a message in 3-4 weeks if no improvement with the hands.   Ice/cold pack over area for 10-15 min twice daily.  Heat (pad or rice pillow in microwave) over affected area, 10-15 minutes twice daily.   OK to take Tylenol 1000 mg (2 extra strength tabs) or 975 mg (3 regular strength tabs) every 6 hours as needed.  Let us know if you need anything.  Hand Exercises Hand exercises can be helpful for almost anyone. These exercises can strengthen the hands, improve flexibility and movement, and increase blood flow to the hands. These results can make work and daily tasks easier. Hand exercises can be especially helpful for people who have joint pain from arthritis or have nerve damage from overuse (carpal tunnel syndrome). These exercises can also help people who have injured a hand. Exercises Most of these hand exercises are gentle stretching and motion exercises. It is usually safe to do them often throughout the day. Warming up your hands before exercise may help to reduce stiffness. You can do this with gentle massage or by placing your hands in warm water for 10-15 minutes. It is normal to feel some stretching, pulling, tightness, or mild discomfort as you begin new exercises. This will gradually improve. Stop an exercise right away if you feel sudden, severe pain or your pain gets worse. Ask your health care provider which exercises are best for you. Knuckle bend or "claw" fist Stand or sit with your arm, hand, and all five fingers pointed straight up. Make sure to keep your wrist straight during the exercise. Gently bend  your fingers down toward your palm until the tips of your fingers are touching the top of your palm. Keep your big knuckle straight and just bend the small knuckles in your fingers. Hold this position for 3 seconds. Straighten (extend) your fingers back to the starting position. Repeat this exercise 5-10 times with each hand. Full finger fist Stand or sit with your arm, hand, and all five fingers pointed straight up. Make sure to keep your wrist straight during the exercise. Gently bend your fingers into your palm until the tips of your fingers are touching the middle of your palm. Hold this position for 3 seconds. Extend your fingers back to the starting position, stretching every joint fully. Repeat this exercise 5-10 times with each hand. Straight fist Stand or sit with your arm, hand, and all five fingers pointed straight up. Make sure to keep your wrist straight during the exercise. Gently bend your fingers at the big knuckle, where your fingers meet your hand, and the middle knuckle. Keep the knuckle at the tips of your fingers straight and try to touch the bottom of your palm. Hold this position for 3 seconds. Extend your fingers back to the starting position, stretching every joint fully. Repeat this exercise 5-10 times with each hand. Tabletop Stand or sit with your arm, hand, and all five fingers pointed straight up. Make sure to keep your wrist straight during the exercise. Gently bend your fingers at  the big knuckle, where your fingers meet your hand, as far down as you can while keeping the small knuckles in your fingers straight. Think of forming a tabletop with your fingers. Hold this position for 3 seconds. Extend your fingers back to the starting position, stretching every joint fully. Repeat this exercise 5-10 times with each hand. Finger spread Place your hand flat on a table with your palm facing down. Make sure your wrist stays straight as you do this exercise. Spread  your fingers and thumb apart from each other as far as you can until you feel a gentle stretch. Hold this position for 3 seconds. Bring your fingers and thumb tight together again. Hold this position for 3 seconds. Repeat this exercise 5-10 times with each hand. Making circles Stand or sit with your arm, hand, and all five fingers pointed straight up. Make sure to keep your wrist straight during the exercise. Make a circle by touching the tip of your thumb to the tip of your index finger. Hold for 3 seconds. Then open your hand wide. Repeat this motion with your thumb and each finger on your hand. Repeat this exercise 5-10 times with each hand. Thumb motion Sit with your forearm resting on a table and your wrist straight. Your thumb should be facing up toward the ceiling. Keep your fingers relaxed as you move your thumb. Lift your thumb up as high as you can toward the ceiling. Hold for 3 seconds. Bend your thumb across your palm as far as you can, reaching the tip of your thumb for the small finger (pinkie) side of your palm. Hold for 3 seconds. Repeat this exercise 5-10 times with each hand. Grip strengthening  Hold a stress ball or other soft ball in the middle of your hand. Slowly increase the pressure, squeezing the ball as much as you can without causing pain. Think of bringing the tips of your fingers into the middle of your palm. All of your finger joints should bend when doing this exercise. Hold your squeeze for 3 seconds, then relax. Repeat this exercise 5-10 times with each hand. Contact a health care provider if: Your hand pain or discomfort gets much worse when you do an exercise. Your hand pain or discomfort does not improve within 2 hours after you exercise. If you have any of these problems, stop doing these exercises right away. Do not do them again unless your health care provider says that you can. Get help right away if: You develop sudden, severe hand pain or swelling.  If this happens, stop doing these exercises right away. Do not do them again unless your health care provider says that you can. Make sure you discuss any questions you have with your health care provider. Document Revised: 12/13/2018 Document Reviewed: 08/23/2018 Elsevier Patient Education  2020 [employer].

## 2023-01-11 ENCOUNTER — Other Ambulatory Visit: Payer: Self-pay | Admitting: Family Medicine

## 2023-01-11 DIAGNOSIS — E785 Hyperlipidemia, unspecified: Secondary | ICD-10-CM

## 2023-01-11 LAB — LIPID PANEL
Cholesterol: 239 mg/dL — ABNORMAL HIGH (ref 0–200)
HDL: 66.9 mg/dL (ref >39.00)
LDL Cholesterol: 150 mg/dL — ABNORMAL HIGH (ref 0–99)
NonHDL: 171.81
Total CHOL/HDL Ratio: 4
Triglycerides: 109 mg/dL (ref 0.0–149.0)
VLDL: 21.8 mg/dL (ref 0.0–40.0)

## 2023-01-11 LAB — CBC
HCT: 42.3 % (ref 36.0–46.0)
Hemoglobin: 14 g/dL (ref 12.0–15.0)
MCHC: 33.2 g/dL (ref 30.0–36.0)
MCV: 89.7 fl (ref 78.0–100.0)
Platelets: 358 10*3/uL (ref 150.0–400.0)
RBC: 4.72 Mil/uL (ref 3.87–5.11)
RDW: 12.9 % (ref 11.5–15.5)
WBC: 6.9 10*3/uL (ref 4.0–10.5)

## 2023-01-11 LAB — COMPREHENSIVE METABOLIC PANEL
ALT: 13 U/L (ref 0–35)
AST: 15 U/L (ref 0–37)
Albumin: 4.4 g/dL (ref 3.5–5.2)
Alkaline Phosphatase: 108 U/L (ref 39–117)
BUN: 16 mg/dL (ref 6–23)
CO2: 29 mEq/L (ref 19–32)
Calcium: 10.1 mg/dL (ref 8.4–10.5)
Chloride: 102 mEq/L (ref 96–112)
Creatinine, Ser: 0.96 mg/dL (ref 0.40–1.20)
GFR: 61.69 mL/min (ref >60.00)
Glucose, Bld: 88 mg/dL (ref 70–99)
Potassium: 4.7 mEq/L (ref 3.5–5.1)
Sodium: 140 mEq/L (ref 135–145)
Total Bilirubin: 0.5 mg/dL (ref 0.2–1.2)
Total Protein: 7.2 g/dL (ref 6.0–8.3)

## 2023-01-11 LAB — VITAMIN D 25 HYDROXY (VIT D DEFICIENCY, FRACTURES): VITD: 39.17 ng/mL (ref 30.00–100.00)

## 2023-01-25 ENCOUNTER — Encounter: Payer: Self-pay | Admitting: Podiatry

## 2023-01-26 ENCOUNTER — Ambulatory Visit (HOSPITAL_BASED_OUTPATIENT_CLINIC_OR_DEPARTMENT_OTHER)
Admission: RE | Admit: 2023-01-26 | Discharge: 2023-01-26 | Disposition: A | Discharge status: Home / Self Care | Payer: PPO | Source: Ambulatory Visit | Attending: Family Medicine | Admitting: Family Medicine

## 2023-01-26 DIAGNOSIS — Z780 Asymptomatic menopausal state: Secondary | ICD-10-CM | POA: Diagnosis not present

## 2023-01-26 DIAGNOSIS — M85852 Other specified disorders of bone density and structure, left thigh: Secondary | ICD-10-CM

## 2023-02-21 DIAGNOSIS — E785 Hyperlipidemia, unspecified: Secondary | ICD-10-CM | POA: Diagnosis not present

## 2023-02-21 DIAGNOSIS — I10 Essential (primary) hypertension: Secondary | ICD-10-CM | POA: Diagnosis not present

## 2023-02-22 ENCOUNTER — Other Ambulatory Visit (INDEPENDENT_AMBULATORY_CARE_PROVIDER_SITE_OTHER): Payer: 59

## 2023-02-22 DIAGNOSIS — E785 Hyperlipidemia, unspecified: Secondary | ICD-10-CM | POA: Diagnosis not present

## 2023-02-23 LAB — LIPID PANEL
Cholesterol: 192 mg/dL (ref 0–200)
HDL: 66.7 mg/dL (ref >39.00)
LDL Cholesterol: 104 mg/dL — ABNORMAL HIGH (ref 0–99)
NonHDL: 124.86
Total CHOL/HDL Ratio: 3
Triglycerides: 106 mg/dL (ref 0.0–149.0)
VLDL: 21.2 mg/dL (ref 0.0–40.0)

## 2023-02-24 ENCOUNTER — Other Ambulatory Visit: Payer: Self-pay | Admitting: Family Medicine

## 2023-02-24 DIAGNOSIS — F341 Dysthymic disorder: Secondary | ICD-10-CM

## 2023-02-24 MED ORDER — VENLAFAXINE HCL ER 75 MG PO CP24
ORAL_CAPSULE | ORAL | 0 refills | Status: DC
Start: 2023-02-24 — End: 2023-05-22

## 2023-02-24 MED ORDER — VENLAFAXINE HCL ER 75 MG PO CP24: ORAL_CAPSULE | ORAL | 0 refills | Status: DC

## 2023-02-24 NOTE — Telephone Encounter (Signed)
Pt called stating that medication was supposed to go to the following pharmacy:  [facility] [address] [place], [place], [place] [address] P: [phone]  Pt would also like to have [facility] removed off her pharmacy list when possible.

## 2023-02-24 NOTE — Addendum Note (Signed)
Addended by: Maximino Sarin on: 02/24/2023 02:06 PM   Modules accepted: Orders

## 2023-02-24 NOTE — Telephone Encounter (Signed)
Rx sent to [facility] on [address]

## 2023-03-18 ENCOUNTER — Other Ambulatory Visit: Payer: Self-pay | Admitting: Family Medicine

## 2023-03-18 DIAGNOSIS — I10 Essential (primary) hypertension: Secondary | ICD-10-CM

## 2023-05-04 ENCOUNTER — Encounter: Payer: Self-pay | Admitting: Family Medicine

## 2023-05-09 ENCOUNTER — Encounter: Payer: Self-pay | Admitting: Physician Assistant

## 2023-05-09 ENCOUNTER — Ambulatory Visit: Payer: 59 | Admitting: Family Medicine

## 2023-05-09 ENCOUNTER — Ambulatory Visit (INDEPENDENT_AMBULATORY_CARE_PROVIDER_SITE_OTHER): Payer: 59 | Admitting: Physician Assistant

## 2023-05-09 VITALS — BP 110/74 | HR 55 | Temp 98.0°F | Wt 129.4 lb

## 2023-05-09 DIAGNOSIS — N6332 Unspecified lump in axillary tail of the left breast: Secondary | ICD-10-CM

## 2023-05-09 NOTE — Progress Notes (Signed)
      Established patient visit   Patient: Casey Frazier   DOB: 1956/05/07   67 y.o. Female  MRN: [id number] Visit Date: 05/09/2023  Today's healthcare provider: Alfredia Ferguson, PA-C   Chief Complaint  Patient presents with   lump under arm    Lump under left arm noticed June of 2024   Subjective    HPI Pt reports lump underneath L arm x 3 months. Denies pain. Reports it has moved, was higher up in her axilla and now is more in her breast area.   Medications: Outpatient Medications Prior to Visit  Medication Sig   amLODipine (NORVASC) 5 MG tablet Take 1 tablet (5 mg total) by mouth daily.   diphenhydrAMINE (BENADRYL) 25 MG tablet Take 25 mg by mouth daily as needed for allergies.   lisinopril (ZESTRIL) 40 MG tablet Take 1 tablet by mouth once daily   Multiple Vitamin (MULTIVITAMIN WITH MINERALS) TABS tablet Take 1 tablet by mouth daily.   venlafaxine XR (EFFEXOR-XR) 75 MG 24 hr capsule TAKE ONE CAPSULE BY MOUTH DAILY WITH BREAKFAST   No facility-administered medications prior to visit.    Review of Systems  Constitutional:  Negative for fatigue and fever.  Respiratory:  Negative for cough and shortness of breath.   Cardiovascular:  Negative for chest pain and leg swelling.  Gastrointestinal:  Negative for abdominal pain.  Neurological:  Negative for dizziness and headaches.      Objective    BP 110/74 (BP Location: Left Arm, Patient Position: Sitting, Cuff Size: Small)   Pulse (!) 55   Temp 98 F (36.7 C) (Oral)   Wt 129 lb 6.4 oz (58.7 kg)   SpO2 98%   BMI 23.67 kg/m   Physical Exam Vitals reviewed.  Constitutional:      Appearance: She is not ill-appearing.  HENT:     Head: Normocephalic.  Eyes:     Conjunctiva/sclera: Conjunctivae normal.  Cardiovascular:     Rate and Rhythm: Normal rate.  Pulmonary:     Effort: Pulmonary effort is normal. No respiratory distress.  Chest:     Comments: B/l axillas w/o masses or lymphadenopathy L axillary  tail/axilla with some fullness appreciated not felt on the right side.  L breast with no skin thickening, masses Neurological:     General: No focal deficit present.     Mental Status: She is alert and oriented to person, place, and time.  Psychiatric:        Mood and Affect: Mood normal.        Behavior: Behavior normal.      No results found for any visits on 05/09/23.  Assessment & Plan     1. Mass of axillary tail of left breast More of a fullness, no palpable mass.  Recommending targeted mammo/sono  - [place] LIMITED ULTRASOUND INCLUDING AXILLA LEFT BREAST  - MM 3D DIAGNOSTIC MAMMOGRAM UNILATERAL LEFT BREAST   Return if symptoms worsen or fail to improve.     I, Alfredia Ferguson, PA-C have reviewed all documentation for this visit. The documentation on  05/09/23   for the exam, diagnosis, procedures, and orders are all accurate and complete.    Alfredia Ferguson, PA-C  [facility] [phone] (phone) [phone] (fax)  [facility]

## 2023-05-20 ENCOUNTER — Other Ambulatory Visit: Payer: Self-pay | Admitting: Family Medicine

## 2023-05-20 DIAGNOSIS — F341 Dysthymic disorder: Secondary | ICD-10-CM

## 2023-06-05 ENCOUNTER — Other Ambulatory Visit: Payer: 59

## 2023-06-15 ENCOUNTER — Ambulatory Visit
Admission: RE | Admit: 2023-06-15 | Discharge: 2023-06-15 | Disposition: A | Discharge status: Home / Self Care | Payer: 59 | Source: Ambulatory Visit | Attending: Physician Assistant | Admitting: Physician Assistant

## 2023-06-15 DIAGNOSIS — N6332 Unspecified lump in axillary tail of the left breast: Secondary | ICD-10-CM | POA: Diagnosis not present

## 2023-06-21 ENCOUNTER — Other Ambulatory Visit: Payer: Self-pay | Admitting: Family Medicine

## 2023-06-21 ENCOUNTER — Other Ambulatory Visit (HOSPITAL_BASED_OUTPATIENT_CLINIC_OR_DEPARTMENT_OTHER): Payer: Self-pay

## 2023-06-21 ENCOUNTER — Ambulatory Visit (HOSPITAL_BASED_OUTPATIENT_CLINIC_OR_DEPARTMENT_OTHER): Payer: 59 | Admitting: Orthopaedic Surgery

## 2023-06-21 DIAGNOSIS — I10 Essential (primary) hypertension: Secondary | ICD-10-CM

## 2023-06-21 DIAGNOSIS — M545 Low back pain, unspecified: Secondary | ICD-10-CM

## 2023-06-21 MED ORDER — METHOCARBAMOL 500 MG PO TABS
500.0000 mg | ORAL_TABLET | Freq: Three times a day (TID) | ORAL | 1 refills | Status: AC | PRN
  Filled 2023-06-21: qty 30, 10d supply, fill #0

## 2023-06-21 MED ORDER — METHOCARBAMOL 500 MG PO TABS: 500.0000 mg | ORAL_TABLET | Freq: Three times a day (TID) | ORAL | 1 refills | Status: DC | PRN

## 2023-06-21 NOTE — Progress Notes (Signed)
Chief Complaint: Lower back pain     History of Present Illness:    Casey Frazier is a 67 y.o. female presents today with 2 weeks of lower back pain.  She did not have any antecedent injury.  She was having very difficult time twisting about the back.  This is subsequently improved.  Denies any pain radiating to the lower extremities.    Surgical History:   None  PMH/PSH/Family History/Social History/Meds/Allergies:    Past Medical History:  Diagnosis Date   Allergy    Anxiety    Colon polyps    Depression    Fibroid    history of uterine fibroids   GERD (gastroesophageal reflux disease)    Hayfever    Headache    migraines   Heart murmur    History of colon polyps    Hypertension    Migraines    Osteopenia    Past Surgical History:  Procedure Laterality Date   ABDOMINAL HYSTERECTOMY  1999   complete   BREAST BIOPSY Left    benign   COLONOSCOPY WITH PROPOFOL N/A 03/24/2017   Procedure: COLONOSCOPY WITH PROPOFOL;  Surgeon: Sherrilyn Rist, MD;  Location: [facility];  Service: Gastroenterology;  Laterality: N/A;   colonscopy     with polyps removed   ESOPHAGOGASTRODUODENOSCOPY (EGD) WITH PROPOFOL N/A 03/24/2017   Procedure: ESOPHAGOGASTRODUODENOSCOPY (EGD) WITH PROPOFOL;  Surgeon: Sherrilyn Rist, MD;  Location: [facility];  Service: Gastroenterology;  Laterality: N/A;   FOOT SURGERY     Social History   Socioeconomic History   Marital status: Single    Spouse name: Not on file   Number of children: 0   Years of education: Not on file   Highest education level: Associate degree: academic program  Occupational History   Not on file  Tobacco Use   Smoking status: Former    Current packs/day: 0.00    Types: Cigarettes    Quit date: 09/05/1986    Years since quitting: 36.8   Smokeless tobacco: Never   Tobacco comments:    college years  Vaping Use   Vaping status: Never Used  Substance and Sexual Activity    Alcohol use: No   Drug use: No   Sexual activity: Yes    Birth control/protection: Surgical    Comment: hysterectomy   Other Topics Concern   Not on file  Social History Narrative   Not on file   Social Determinants of Health   Financial Resource Strain: High Risk (05/07/2023)   Overall Financial Resource Strain (CARDIA)    Difficulty of Paying Living Expenses: Very hard  Food Insecurity: Food Insecurity Present (05/07/2023)   Hunger Vital Sign    Worried About Running Out of Food in the Last Year: Sometimes true    Ran Out of Food in the Last Year: Never true  Transportation Needs: No Transportation Needs (05/07/2023)   PRAPARE - Administrator, Civil Service (Medical): No    Lack of Transportation (Non-Medical): No  Physical Activity: Insufficiently Active (05/07/2023)   Exercise Vital Sign    Days of Exercise per Week: 2 days    Minutes of Exercise per Session: 10 min  Stress: Stress Concern Present (05/07/2023)   [employer] of Occupational Health - Occupational Stress Questionnaire  Feeling of Stress : Rather much  Social Connections: Unknown (05/07/2023)   Social Connection and Isolation Panel [NHANES]    Frequency of Communication with Friends and Family: Patient declined    Frequency of Social Gatherings with Friends and Family: Once a week    Attends Religious Services: 1 to 4 times per year    Active Member of [employer] or Organizations: No    Attends Engineer, structural: Not on file    Marital Status: Divorced   Family History  Problem Relation Age of Onset   Breast cancer Mother 36   Lung cancer Mother    Hypertension Mother    Hypertension Maternal Aunt    Liver cancer Father        probable   Hypertension Father    Colon cancer Father    Ulcerative colitis Sister    Breast cancer Maternal Grandmother    Clotting disorder Paternal Grandmother    Clotting disorder Paternal Uncle    No Known Allergies Current Outpatient Medications   Medication Sig Dispense Refill   amLODipine (NORVASC) 5 MG tablet Take 1 tablet (5 mg total) by mouth daily. 90 tablet 3   diphenhydrAMINE (BENADRYL) 25 MG tablet Take 25 mg by mouth daily as needed for allergies.     lisinopril (ZESTRIL) 40 MG tablet Take 1 tablet by mouth once daily 90 tablet 0   methocarbamol (ROBAXIN) 500 MG tablet Take 1 tablet (500 mg total) by mouth every 8 (eight) hours as needed for muscle spasms. 30 tablet 1   Multiple Vitamin (MULTIVITAMIN WITH MINERALS) TABS tablet Take 1 tablet by mouth daily.     venlafaxine XR (EFFEXOR-XR) 75 MG 24 hr capsule TAKE 1 CAPSULE BY MOUTH ONCE DAILY WITH BREAKFAST 90 capsule 0   No current facility-administered medications for this visit.   No results found.  Review of Systems:   A ROS was performed including pertinent positives and negatives as documented in the HPI.  Physical Exam :   Constitutional: NAD and appears stated age Neurological: Alert and oriented Psych: Appropriate affect and cooperative There were no vitals taken for this visit.   Comprehensive Musculoskeletal Exam:    Tenderness palpation about the paraspinal musculature although this is much improved.  Negative straight leg raise with no weakness of bilateral lower extremities  Imaging:     I personally reviewed and interpreted the radiographs.   Assessment:   67 y.o. female with lower back pain consistent with lower back spasm.  I did counsel her on heat and progressive range of motion as she can tolerate.  Will plan to provide her for a prescription of Robaxin should this happen in the future.  I will plan to see her back as needed  Plan :    -Return to clinic as needed     I personally saw and evaluated the patient, and participated in the management and treatment plan.  Huel Cote, MD Attending Physician, Orthopedic Surgery  This document was dictated using Dragon voice recognition software. A reasonable attempt at proof reading has  been made to minimize errors.

## 2023-07-03 ENCOUNTER — Other Ambulatory Visit: Payer: 59

## 2023-08-24 ENCOUNTER — Other Ambulatory Visit: Payer: Self-pay | Admitting: Family Medicine

## 2023-08-24 DIAGNOSIS — F341 Dysthymic disorder: Secondary | ICD-10-CM

## 2023-09-10 ENCOUNTER — Other Ambulatory Visit: Payer: Self-pay | Admitting: Family Medicine

## 2023-09-10 DIAGNOSIS — I10 Essential (primary) hypertension: Secondary | ICD-10-CM

## 2023-09-12 ENCOUNTER — Telehealth: Payer: Self-pay | Admitting: Family Medicine

## 2023-09-12 NOTE — Telephone Encounter (Signed)
 Copied from CRM [id number]. Topic: Medicare AWV >> Sep 12, 2023 11:26 AM Nathanel DEL wrote: Reason for CRM: Called LVM 09/12/2023 to schedule AWV. Please schedule Virtual or Telehealth visits ONLY.   Nathanel Paschal; Care Guide Ambulatory Clinical Support [place] [facility] Direct Dial: [phone]

## 2023-11-07 ENCOUNTER — Encounter: Payer: 59 | Admitting: Family Medicine

## 2023-11-16 ENCOUNTER — Other Ambulatory Visit: Payer: Self-pay | Admitting: Family Medicine

## 2023-11-16 DIAGNOSIS — F341 Dysthymic disorder: Secondary | ICD-10-CM

## 2023-11-17 MED ORDER — VENLAFAXINE HCL ER 75 MG PO CP24
75.0000 mg | ORAL_CAPSULE | Freq: Every day | ORAL | 0 refills | Status: AC
Start: 2023-11-17 — End: ?

## 2023-12-09 ENCOUNTER — Other Ambulatory Visit: Payer: Self-pay | Admitting: Family Medicine

## 2023-12-09 DIAGNOSIS — I10 Essential (primary) hypertension: Secondary | ICD-10-CM

## 2024-01-06 ENCOUNTER — Other Ambulatory Visit: Payer: Self-pay | Admitting: Family Medicine

## 2024-01-06 DIAGNOSIS — I10 Essential (primary) hypertension: Secondary | ICD-10-CM

## 2024-01-15 ENCOUNTER — Encounter: Payer: 59 | Admitting: Family Medicine

## 2024-02-19 ENCOUNTER — Other Ambulatory Visit: Payer: Self-pay | Admitting: Family Medicine

## 2024-02-19 DIAGNOSIS — F341 Dysthymic disorder: Secondary | ICD-10-CM

## 2024-03-20 ENCOUNTER — Other Ambulatory Visit: Payer: Self-pay | Admitting: Family Medicine

## 2024-03-20 DIAGNOSIS — I10 Essential (primary) hypertension: Secondary | ICD-10-CM

## 2024-03-29 ENCOUNTER — Ambulatory Visit: Payer: Self-pay | Admitting: *Deleted

## 2024-03-29 ENCOUNTER — Other Ambulatory Visit: Payer: Self-pay | Admitting: *Deleted

## 2024-03-29 DIAGNOSIS — F341 Dysthymic disorder: Secondary | ICD-10-CM

## 2024-03-29 MED ORDER — VENLAFAXINE HCL ER 75 MG PO CP24: 75.0000 mg | ORAL_CAPSULE | Freq: Every day | ORAL | 0 refills | Status: DC

## 2024-03-29 NOTE — Addendum Note (Signed)
 Addended by: FRANN MABEL SQUIBB on: 03/29/2024 12:12 PM   Modules accepted: Orders

## 2024-03-29 NOTE — Telephone Encounter (Signed)
 Will refill.

## 2024-03-29 NOTE — Telephone Encounter (Signed)
 Copied from CRM #[id number]. Topic: Clinical - Red Word Triage >> Mar 29, 2024 11:46 AM Lavanda D wrote: Red Word that prompted transfer to Nurse Triage: Dizzy spells, lightheaded, anxiety. She is taking venlafaxine  XR (EFFEXOR -XR) 75 MG 24 hr capsule but she out of it currently, she doesn't have her appointment until 8/4. She is feeling a difference off the medication - she would like a supply from now until her appt if possible. Reason for Disposition  [1] Caller has URGENT medicine question about med that primary care doctor (or NP/PA) or specialist prescribed AND [2] triager unable to answer question  Answer Assessment - Initial Assessment Questions 1. NAME of MEDICINE: What medicine(s) are you calling about?     Effexor -XR 75 mg 2. QUESTION: What is your question? (e.g., double dose of medicine, side effect)     I've been out of it .    I'm getting dizzy, lightheaded and sweats from being without it.   I'm out for 5 days.   Can I get a few until I see the dr on 04/09/2023? 3. PRESCRIBER: Who prescribed the medicine? Reason: if prescribed by specialist, call should be referred to that group.     Dr. Frann 4. SYMPTOMS: Do you have any symptoms? If Yes, ask: What symptoms are you having?  How bad are the symptoms (e.g., mild, moderate, severe)     Dizziness, lightheadedness, sweating from being without it.   5. PREGNANCY:  Is there any chance that you are pregnant? When was your last menstrual period?     N/A  Protocols used: Medication Question Call-A-AH  Pt has been out of Effexor -XR 75 mg for 5 days.  02/19/2024 #30, 0 refills was last rx for it.   She cancelled her appt for physical on 01/15/2024 and rescheduled it for 04/08/2024 with Dr. Frann.   She is having symptoms from being off of it.   Would Dr. Frann be willing to call in enough to get her to her upcoming appt?   Please send to [facility] at [facility].     FYI Only or Action Required?:  Action required by provider: medication refill request.  Patient was last seen in primary care on 01/10/2023 by Frann Mabel Mt, DO.  Called Nurse Triage reporting Medication Problem. Dizziness, lightheadedness and sweating from being off of Effexor -XR for 5 days now.  Symptoms began a week ago. And getting worse.  Interventions attempted: Nothing.  Needs enough medication to get to her upcoming appt.  Symptoms are: rapidly worsening.  Triage Disposition: Call PCP Now  Patient/caregiver understands and will follow disposition?: Yes

## 2024-04-08 ENCOUNTER — Ambulatory Visit

## 2024-04-09 ENCOUNTER — Other Ambulatory Visit: Payer: Self-pay | Admitting: Family Medicine

## 2024-04-09 DIAGNOSIS — I10 Essential (primary) hypertension: Secondary | ICD-10-CM

## 2024-04-10 ENCOUNTER — Ambulatory Visit

## 2024-04-12 ENCOUNTER — Other Ambulatory Visit: Payer: Self-pay | Admitting: Family Medicine

## 2024-04-12 ENCOUNTER — Telehealth: Payer: Self-pay | Admitting: *Deleted

## 2024-04-12 ENCOUNTER — Ambulatory Visit

## 2024-04-12 ENCOUNTER — Telehealth: Payer: Self-pay

## 2024-04-12 ENCOUNTER — Ambulatory Visit: Admitting: *Deleted

## 2024-04-12 ENCOUNTER — Other Ambulatory Visit (HOSPITAL_BASED_OUTPATIENT_CLINIC_OR_DEPARTMENT_OTHER): Payer: Self-pay

## 2024-04-12 VITALS — Ht 62.0 in | Wt 129.0 lb

## 2024-04-12 DIAGNOSIS — Z5982 Transportation insecurity: Secondary | ICD-10-CM

## 2024-04-12 DIAGNOSIS — Z1231 Encounter for screening mammogram for malignant neoplasm of breast: Secondary | ICD-10-CM

## 2024-04-12 DIAGNOSIS — Z0000 Encounter for general adult medical examination without abnormal findings: Secondary | ICD-10-CM

## 2024-04-12 DIAGNOSIS — Z5941 Food insecurity: Secondary | ICD-10-CM | POA: Diagnosis not present

## 2024-04-12 MED ORDER — VENLAFAXINE HCL ER 75 MG PO CP24
75.0000 mg | ORAL_CAPSULE | Freq: Every day | ORAL | 0 refills | Status: DC
  Filled 2024-04-12: qty 30, 30d supply, fill #0

## 2024-04-12 NOTE — Progress Notes (Signed)
 Subjective:   Casey Frazier is a 68 y.o. who presents for a Medicare Wellness preventive visit.  As a reminder, Annual Wellness Visits don't include a physical exam, and some assessments may be limited, especially if this visit is performed virtually. We may recommend an in-person follow-up visit with your provider if needed.  Visit Complete: Virtual I connected with  Dickey LITTIE Pepper on 04/12/24 by a audio enabled telemedicine application and verified that I am speaking with the correct person using two identifiers.  Patient Location: Home  Provider Location: Office/Clinic  I discussed the limitations of evaluation and management by telemedicine. The patient expressed understanding and agreed to proceed.  Vital Signs: Because this visit was a virtual/telehealth visit, some criteria may be missing or patient reported. Any vitals not documented were not able to be obtained and vitals that have been documented are patient reported.  VideoDeclined- This patient declined Librarian, academic. Therefore the visit was completed with audio only.  Persons Participating in Visit: Patient.  AWV Questionnaire: Yes: Patient Medicare AWV questionnaire was completed by the patient on 04/11/24; I have confirmed that all information answered by patient is correct and no changes since this date.  Cardiac Risk Factors include: advanced age (>3men, >73 women);hypertension     Objective:    Today's Vitals   04/12/24 1111  Weight: 129 lb (58.5 kg)  Height: 5' 2 (1.575 m)   Body mass index is 23.59 kg/m.     04/12/2024   11:37 AM 07/18/2022    1:50 PM 03/22/2017   11:46 AM  Advanced Directives  Does Patient Have a Medical Advance Directive? Yes No No   Type of Estate agent of [place];Living will    Does patient want to make changes to medical advance directive? No - Patient declined    Copy of Healthcare Power of Attorney in Chart? No - copy  requested    Would patient like information on creating a medical advance directive?  No - Patient declined No - Patient declined      Data saved with a previous flowsheet row definition    Current Medications (verified) Outpatient Encounter Medications as of 04/12/2024  Medication Sig   amLODipine  (NORVASC ) 5 MG tablet Take 1 tablet by mouth once daily   diphenhydrAMINE (BENADRYL) 25 MG tablet Take 25 mg by mouth daily as needed for allergies.   lisinopril  (ZESTRIL ) 40 MG tablet Take 1 tablet (40 mg total) by mouth daily. Needs appt   methocarbamol  (ROBAXIN ) 500 MG tablet Take 1 tablet (500 mg total) by mouth every 8 (eight) hours as needed for muscle spasms.   Multiple Vitamin (MULTIVITAMIN WITH MINERALS) TABS tablet Take 1 tablet by mouth daily.   venlafaxine  XR (EFFEXOR  XR) 75 MG 24 hr capsule Take 1 capsule (75 mg total) by mouth daily with breakfast.   No facility-administered encounter medications on file as of 04/12/2024.    Allergies (verified) Patient has no known allergies.   History: Past Medical History:  Diagnosis Date   Allergy    Anxiety    Colon polyps    Depression    Fibroid    history of uterine fibroids   GERD (gastroesophageal reflux disease)    Hayfever    Headache    migraines   Heart murmur    History of colon polyps    Hypertension    Migraines    Osteopenia    Past Surgical History:  Procedure Laterality Date  ABDOMINAL HYSTERECTOMY  1999   complete   BREAST BIOPSY Left    benign   COLONOSCOPY WITH PROPOFOL  N/A 03/24/2017   Procedure: COLONOSCOPY WITH PROPOFOL ;  Surgeon: Legrand Victory LITTIE DOUGLAS, MD;  Location: [facility];  Service: Gastroenterology;  Laterality: N/A;   colonscopy     with polyps removed   ESOPHAGOGASTRODUODENOSCOPY (EGD) WITH PROPOFOL  N/A 03/24/2017   Procedure: ESOPHAGOGASTRODUODENOSCOPY (EGD) WITH PROPOFOL ;  Surgeon: Legrand Victory LITTIE DOUGLAS, MD;  Location: [facility];  Service: Gastroenterology;  Laterality: N/A;   FOOT SURGERY      Family History  Problem Relation Age of Onset   Breast cancer Mother 30   Lung cancer Mother    Hypertension Mother    Hypertension Maternal Aunt    Liver cancer Father        probable   Hypertension Father    Colon cancer Father    Ulcerative colitis Sister    Breast cancer Maternal Grandmother    Clotting disorder Paternal Grandmother    Clotting disorder Paternal Uncle    Social History   Socioeconomic History   Marital status: Single    Spouse name: Not on file   Number of children: 0   Years of education: Not on file   Highest education level: Associate degree: academic program  Occupational History   Not on file  Tobacco Use   Smoking status: Former    Current packs/day: 0.00    Types: Cigarettes    Quit date: 09/05/1986    Years since quitting: 37.6   Smokeless tobacco: Never   Tobacco comments:    college years  Vaping Use   Vaping status: Never Used  Substance and Sexual Activity   Alcohol use: No   Drug use: No   Sexual activity: Yes    Birth control/protection: Surgical    Comment: hysterectomy   Other Topics Concern   Not on file  Social History Narrative   Not on file   Social Drivers of Health   Financial Resource Strain: Low Risk  (04/11/2024)   Overall Financial Resource Strain (CARDIA)    Difficulty of Paying Living Expenses: Not very hard  Food Insecurity: Food Insecurity Present (04/11/2024)   Hunger Vital Sign    Worried About Running Out of Food in the Last Year: Never true    Ran Out of Food in the Last Year: Sometimes true  Transportation Needs: Unmet Transportation Needs (04/11/2024)   PRAPARE - Transportation    Lack of Transportation (Medical): Yes    Lack of Transportation (Non-Medical): Yes  Physical Activity: Insufficiently Active (04/12/2024)   Exercise Vital Sign    Days of Exercise per Week: 1 day    Minutes of Exercise per Session: 30 min  Stress: Stress Concern Present (04/12/2024)   [employer] of Occupational Health -  Occupational Stress Questionnaire    Feeling of Stress: Rather much  Social Connections: Socially Isolated (04/12/2024)   Social Connection and Isolation Panel    Frequency of Communication with Friends and Family: Once a week    Frequency of Social Gatherings with Friends and Family: Once a week    Attends Religious Services: 1 to 4 times per year    Active Member of [employer] or Organizations: No    Attends Banker Meetings: Never    Marital Status: Divorced    Tobacco Counseling Counseling given: Not Answered Tobacco comments: college years    Clinical Intake:  Pre-visit preparation completed: Yes  Pain : No/denies pain  BMI - recorded: 23.59 Nutritional Status: BMI of 19-24  Normal Nutritional Risks: None Diabetes: No  No results found for: HGBA1C   How often do you need to have someone help you when you read instructions, pamphlets, or other written materials from your doctor or pharmacy?: 1 - Never  Interpreter Needed?: No  Information entered by :: Lolita Libra, CMA   Activities of Daily Living     04/11/2024   12:51 PM  In your present state of health, do you have any difficulty performing the following activities:  Hearing? 0  Vision? 0  Difficulty concentrating or making decisions? 1   1  Comment will discuss at upcoming appt  Walking or climbing stairs? 0  Dressing or bathing? 0  Doing errands, shopping? 0  Preparing Food and eating ? N  Using the Toilet? N  In the past six months, have you accidently leaked urine? N  Do you have problems with loss of bowel control? N  Managing your Medications? N  Managing your Finances? N  Housekeeping or managing your Housekeeping? N    Patient Care Team: Frann Mabel Mt, DO as PCP - General (Family Medicine) [facility] (Ophthalmology)  I have updated your Care Teams any recent Medical Services you may have received from other providers in the past year.     Assessment:    This is a routine wellness examination for [facility].  Hearing/Vision screen Hearing Screening - Comments:: Denies hearing difficulties.  Vision Screening - Comments:: Wears RX glasses -- up to date with routine eye exams at [facility].   Goals Addressed               This Visit's Progress     Patient Stated (pt-stated)        She wants to increase from 1 day a week to 3 days weekly and add cardio.  Has silver sneakers membership.        Depression Screen     04/12/2024   11:26 AM 01/10/2023    2:18 PM 10/07/2022    2:11 PM 11/18/2020    4:07 PM 06/23/2020   12:40 PM 03/30/2017    9:51 AM  PHQ 2/9 Scores  PHQ - 2 Score 4 0 2 6 0 6  PHQ- 9 Score 7 2 5 17  18     Fall Risk     04/11/2024   12:51 PM 01/10/2023    2:17 PM 10/07/2022    2:10 PM 12/09/2020   11:59 AM  Fall Risk   Falls in the past year? 0 1 0 0  Number falls in past yr: 0 1 0 0  Comment  has a big dog that pulls her down    Injury with Fall? 0 0 0 0  Risk for fall due to : No Fall Risks No Fall Risks No Fall Risks   Follow up Education provided Falls evaluation completed Falls evaluation completed     MEDICARE RISK AT HOME:  Medicare Risk at Home Any stairs in or around the home?: (Patient-Rptd) No If so, are there any without handrails?: (Patient-Rptd) No Home free of loose throw rugs in walkways, pet beds, electrical cords, etc?: (Patient-Rptd) Yes Adequate lighting in your home to reduce risk of falls?: (Patient-Rptd) Yes Life alert?: (Patient-Rptd) No Use of a cane, walker or w/c?: (Patient-Rptd) No Grab bars in the bathroom?: (Patient-Rptd) No Shower chair or bench in shower?: (Patient-Rptd) No Elevated toilet seat or a handicapped toilet?: (Patient-Rptd) No  TIMED UP AND GO:  Was the test performed?  No,audio  Cognitive Function: 6CIT completed        04/12/2024   11:16 AM  6CIT Screen  What Year? 0 points  What month? 0 points  What time? 0 points  Count back from 20 0 points  Months in  reverse 0 points  Repeat phrase 0 points  Total Score 0 points    Immunizations Immunization History  Administered Date(s) Administered   Fluad Quad(high Dose 65+) 07/18/2022   Influenza,inj,Quad PF,6+ Mos 07/12/2017   Influenza-Unspecified 07/06/2016, 05/21/2019, 06/16/2021   PFIZER(Purple Top)SARS-COV-2 Vaccination 12/15/2019, 01/12/2020   PNEUMOCOCCAL CONJUGATE-20 08/16/2021   Td 10/07/2003   Tdap 04/27/2018   Zoster Recombinant(Shingrix) 12/09/2020, 08/16/2021    Screening Tests Health Maintenance  Topic Date Due   INFLUENZA VACCINE  04/05/2024   COVID-19 Vaccine (3 - 2024-25 season) 04/12/2025 (Originally 05/07/2023)   MAMMOGRAM  01/04/2025   DEXA SCAN  01/25/2025   Medicare Annual Wellness (AWV)  04/12/2025   Colonoscopy  03/25/2027   DTaP/Tdap/Td (3 - Td or Tdap) 04/27/2028   Pneumococcal Vaccine: 50+ Years  Completed   Hepatitis C Screening  Completed   Zoster Vaccines- Shingrix  Completed   Hepatitis B Vaccines  Aged Out   HPV VACCINES  Aged Out   Meningococcal B Vaccine  Aged Out    Health Maintenance  Health Maintenance Due  Topic Date Due   INFLUENZA VACCINE  04/05/2024   Health Maintenance Items Addressed: Mammogram ordered. Will get flu vaccine this fall. All other HM up to date.  Additional Screening:  Vision Screening: Recommended annual ophthalmology exams for early detection of glaucoma and other disorders of the eye. Would you like a referral to an eye doctor? No    Dental Screening: Recommended annual dental exams for proper oral hygiene  Community Resource Referral / Chronic Care Management: CRR required this visit?  Yes , VBCI referral placed  CCM required this visit?  No   Plan:    I have personally reviewed and noted the following in the patient's chart:   Medical and social history Use of alcohol, tobacco or illicit drugs  Current medications and supplements including opioid prescriptions. Patient is not currently taking opioid  prescriptions. Functional ability and status Nutritional status Physical activity Advanced directives List of other physicians Hospitalizations, surgeries, and ER visits in previous 12 months Vitals Screenings to include cognitive, depression, and falls Referrals and appointments  In addition, I have reviewed and discussed with patient certain preventive protocols, quality metrics, and best practice recommendations. A written personalized care plan for preventive services as well as general preventive health recommendations were provided to patient.   Lolita Libra, CMA   04/12/2024   After Visit Summary: (MyChart) Due to this being a telephonic visit, the after visit summary with patients personalized plan was offered to patient via MyChart   Notes: see phone note

## 2024-04-12 NOTE — Telephone Encounter (Signed)
 Pt had AWV today.  She mentions increase in forgetfulness. When entering room forgetting why she went there, opening fridge and forgetting what she was going to get.  Scored 7 on depression screen. Has restarted venlafaxine  and is feeling better. She declines referral for therapy / counselor. Reports great difficulty the last couple of months with lack of a car.  Has used all [employer] vouchers from insurance and is paying out of pocket for additional transportation. This has caused rent and food insecurity.  She has recently moved and feels this will improve her rent situation. Reports lack of transportation has kept her from getting to the store for food at times. She declines resources for food bank. She is looking for part time job to hopefully save money to buy a car. VBCI referral placed and pt is agreeable.   Will discuss these concerns with PCP at appt on 8/12 at 12:45.

## 2024-04-12 NOTE — Patient Instructions (Addendum)
 Casey Frazier , Thank you for taking time out of your busy schedule to complete your Annual Wellness Visit with me. I enjoyed our conversation and look forward to speaking with you again next year. I, as well as your care team,  appreciate your ongoing commitment to your health goals. Please review the following plan we discussed and let me know if I can assist you in the future. Your Game plan/ To Do List   Referrals: If you haven't heard from the office you've been referred to, please reach out to them at the phone provided.   Mammogram / bone density:  [facility], [phone].  Social Work referral:  Assistance with transportation issues / help finding part time job. This may take a few weeks to hear back from. Let me know if you haven't heard from anyone by 04/26/24.  Follow up Visits: Next Medicare AWV with our clinical staff: 04/15/25  1:40pm    Next Office Visit with your provider: 04/16/24  12:45pm  Clinician Recommendations:  Aim for 30 minutes of exercise or brisk walking, 6-8 glasses of water, and 5 servings of fruits and vegetables each day.    You will need to get the following vaccines at your local pharmacy: flu     This is a list of the screening recommended for you and due dates:  Health Maintenance  Topic Date Due   Medicare Annual Wellness Visit  Never done   Flu Shot  04/05/2024   COVID-19 Vaccine (3 - 2024-25 season) 04/12/2025*   Mammogram  01/04/2025   DEXA scan (bone density measurement)  01/25/2025   Colon Cancer Screening  03/25/2027   DTaP/Tdap/Td vaccine (3 - Td or Tdap) 04/27/2028   Pneumococcal Vaccine for age over 45  Completed   Hepatitis C Screening  Completed   Zoster (Shingles) Vaccine  Completed   Hepatitis B Vaccine  Aged Out   HPV Vaccine  Aged Out   Meningitis B Vaccine  Aged Out  *Topic was postponed. The date shown is not the original due date.    Advanced directives: (Copy Requested) Please bring a copy of your health care power of  attorney and living will to the office to be added to your chart at your convenience. You can mail to [facility] [address] [place], [place] [address] or email to [email or web address] Advance Care Planning is important because it:  [x]  Makes sure you receive the medical care that is consistent with your values, goals, and preferences  [x]  It provides guidance to your family and loved ones and reduces their decisional burden about whether or not they are making the right decisions based on your wishes.  Follow the link provided in your after visit summary or read over the paperwork we have mailed to you to help you started getting your Advance Directives in place. If you need assistance in completing these, please reach out to us  so that we can help you!  See attachments for Preventive Care and Fall Prevention Tips.

## 2024-04-12 NOTE — Telephone Encounter (Signed)
 Copied from CRM #[id number]. Topic: Clinical - Medication Question >> Apr 12, 2024  8:32 AM Burnard DEL wrote: Reason for CRM: Patient is scheduled to see provider on Tuesday 04/16/2024. Sh would like to know if she could have a few more of venlafaxine  XR (EFFEXOR  XR) 75 MG 24 hr capsule prescribed to her until she makes it to her appointment?   [facility] [address] [place] - [address] (Ph: [phone])

## 2024-04-14 ENCOUNTER — Other Ambulatory Visit: Payer: Self-pay | Admitting: Family Medicine

## 2024-04-16 ENCOUNTER — Encounter: Payer: Self-pay | Admitting: Family Medicine

## 2024-04-16 ENCOUNTER — Other Ambulatory Visit: Payer: Self-pay

## 2024-04-16 ENCOUNTER — Ambulatory Visit: Payer: Self-pay | Admitting: Family Medicine

## 2024-04-16 ENCOUNTER — Ambulatory Visit (INDEPENDENT_AMBULATORY_CARE_PROVIDER_SITE_OTHER): Admitting: Family Medicine

## 2024-04-16 ENCOUNTER — Other Ambulatory Visit (HOSPITAL_BASED_OUTPATIENT_CLINIC_OR_DEPARTMENT_OTHER): Payer: Self-pay

## 2024-04-16 VITALS — BP 118/76 | HR 100 | Temp 98.0°F | Resp 16 | Ht 62.0 in | Wt 130.0 lb

## 2024-04-16 DIAGNOSIS — R413 Other amnesia: Secondary | ICD-10-CM | POA: Diagnosis not present

## 2024-04-16 DIAGNOSIS — F339 Major depressive disorder, recurrent, unspecified: Secondary | ICD-10-CM | POA: Diagnosis not present

## 2024-04-16 LAB — CBC
HCT: 42.2 % (ref 36.0–46.0)
Hemoglobin: 13.9 g/dL (ref 12.0–15.0)
MCHC: 33 g/dL (ref 30.0–36.0)
MCV: 90.3 fl (ref 78.0–100.0)
Platelets: 312 K/uL (ref 150.0–400.0)
RBC: 4.67 Mil/uL (ref 3.87–5.11)
RDW: 12.4 % (ref 11.5–15.5)
WBC: 6.9 K/uL (ref 4.0–10.5)

## 2024-04-16 LAB — COMPREHENSIVE METABOLIC PANEL WITH GFR
ALT: 17 U/L (ref 0–35)
AST: 17 U/L (ref 0–37)
Albumin: 4.2 g/dL (ref 3.5–5.2)
Alkaline Phosphatase: 91 U/L (ref 39–117)
BUN: 17 mg/dL (ref 6–23)
CO2: 32 meq/L (ref 19–32)
Calcium: 10 mg/dL (ref 8.4–10.5)
Chloride: 101 meq/L (ref 96–112)
Creatinine, Ser: 0.82 mg/dL (ref 0.40–1.20)
GFR: 73.87 mL/min (ref >60.00)
Glucose, Bld: 94 mg/dL (ref 70–99)
Potassium: 4.2 meq/L (ref 3.5–5.1)
Sodium: 139 meq/L (ref 135–145)
Total Bilirubin: 0.4 mg/dL (ref 0.2–1.2)
Total Protein: 7 g/dL (ref 6.0–8.3)

## 2024-04-16 LAB — TSH: TSH: 0.35 u[IU]/mL (ref 0.35–5.50)

## 2024-04-16 LAB — VITAMIN B12: Vitamin B-12: 468 pg/mL (ref 211–911)

## 2024-04-16 MED ORDER — VENLAFAXINE HCL ER 75 MG PO CP24: 75.0000 mg | ORAL_CAPSULE | Freq: Every day | ORAL | 0 refills | Status: DC

## 2024-04-16 MED ORDER — VENLAFAXINE HCL ER 150 MG PO CP24: 150.0000 mg | ORAL_CAPSULE | Freq: Every day | ORAL | 0 refills | Status: DC

## 2024-04-16 MED ORDER — VENLAFAXINE HCL ER 150 MG PO CP24: 150.0000 mg | ORAL_CAPSULE | Freq: Every day | ORAL | 1 refills | Status: DC

## 2024-04-16 NOTE — Progress Notes (Signed)
 Chief Complaint  Patient presents with   Medication Refill    Medication Refill    Subjective: Patient is a 68 y.o. female here for memory issues.  Pt will go into a room and forget why she's there. She will sometimes forget where she puts stuff. This has bene going on for 2-3 mo. She is not sleeping well, getting 4-5 hrs per night. She does nap during the day. She recently moved and had do a lot herself. She had a lot of stress lately. She does some walking for exercise. Diet is fair. Short term memory more affected. She will sometimes misplace items, like putting refrigerated items in the cabinet. No first degree relatives with dementia. No behavioral changes.   Past Medical History:  Diagnosis Date   Allergy    Anxiety    Colon polyps    Depression    Fibroid    history of uterine fibroids   GERD (gastroesophageal reflux disease)    Hayfever    Headache    migraines   Heart murmur    History of colon polyps    Hypertension    Migraines    Osteopenia     Objective: BP 118/76 (BP Location: Left Arm, Patient Position: Sitting)   Pulse 100   Temp 98 F (36.7 C) (Oral)   Resp 16   Ht 5' 2 (1.575 m)   Wt 130 lb (59 kg)   SpO2 96%   BMI 23.78 kg/m  General: Awake, appears stated age Heart: RRR, no LE edema Lungs: CTAB, no rales, wheezes or rhonchi. No accessory muscle use Neuro: DTR's equal and symmetric throughout, no clonus, no cerebellar signs, fluent speech, gait nml Psych: Age appropriate judgment and insight, normal affect and mood  Assessment and Plan: Memory change - Plan: CBC, Comprehensive metabolic panel with GFR, TSH, A87, Ambulatory referral to Neuropsychology, MR Brain Wo Contrast  Depression, recurrent (HCC) - Plan: venlafaxine  XR (EFFEXOR  XR) 150 MG 24 hr capsule  Could be 2/2 #2. Ck labs. Ck MRI brain. Does have metal in foot. Will make note of this and see if we need to change to CT head. Refer neuropsych.  Chronic, unstable. Increase Effexor  XR to  150 mg/d. F/u if not improving.  The patient voiced understanding and agreement to the plan.  Mabel Mt [facility], DO 04/16/24  1:28 PM

## 2024-04-16 NOTE — Patient Instructions (Signed)
 If you do not hear anything about your referral in the next 1-2 weeks, call our office and ask for an update.  Give us  2-3 business days to get the results of your labs back.   Someone will reach out regarding your MRI.  Let us  know if you need anything.

## 2024-04-19 ENCOUNTER — Telehealth: Payer: Self-pay

## 2024-04-19 NOTE — Progress Notes (Signed)
 Complex Care Management Note Care Guide Note  04/19/2024 Name: Casey Frazier MRN: [id number] DOB: 1956/07/05   Complex Care Management Outreach Attempts: An unsuccessful telephone outreach was attempted today to offer the patient information about available complex care management services.  Follow Up Plan:  Additional outreach attempts will be made to offer the patient complex care management information and services.   Encounter Outcome:  No Answer  Dreama [facility]  [facility], [facility] Health Care Management Assistant Direct Dial: [phone]  Fax: [phone]

## 2024-04-24 NOTE — Progress Notes (Signed)
 Complex Care Management Note Care Guide Note  04/24/2024 Name: Casey Frazier MRN: [id number] DOB: 21-Jul-1956   Complex Care Management Outreach Attempts: A second unsuccessful outreach was attempted today to offer the patient with information about available complex care management services.  Follow Up Plan:  Additional outreach attempts will be made to offer the patient complex care management information and services.   Encounter Outcome:  No Answer  Dreama [facility]  [facility], [facility] VBCI Assistant Direct Dial: [phone]  Fax: [phone]

## 2024-04-26 ENCOUNTER — Telehealth: Payer: Self-pay

## 2024-04-26 ENCOUNTER — Other Ambulatory Visit: Payer: Self-pay

## 2024-04-26 DIAGNOSIS — F339 Major depressive disorder, recurrent, unspecified: Secondary | ICD-10-CM

## 2024-04-26 DIAGNOSIS — I10 Essential (primary) hypertension: Secondary | ICD-10-CM

## 2024-04-26 MED ORDER — VENLAFAXINE HCL ER 150 MG PO CP24: 150.0000 mg | ORAL_CAPSULE | Freq: Every day | ORAL | 0 refills | Status: DC

## 2024-04-26 MED ORDER — AMLODIPINE BESYLATE 5 MG PO TABS: 5.0000 mg | ORAL_TABLET | Freq: Every day | ORAL | 0 refills | Status: DC

## 2024-04-26 MED ORDER — LISINOPRIL 40 MG PO TABS: 40.0000 mg | ORAL_TABLET | Freq: Every day | ORAL | 0 refills | Status: DC

## 2024-04-26 NOTE — Telephone Encounter (Signed)
 Copied from CRM [id number]. Topic: Clinical - Medication Question >> Apr 26, 2024  4:30 PM Aisha D wrote: Reason for CRM: Pt is requesting get her medications for the venlafaxine  XR (EFFEXOR  XR) 150 MG 24 hr capsule,lisinopril  (ZESTRIL ) 40 MG tablet, and the amLODipine  (NORVASC ) 5 MG tablet sent to the [facility] delivery pharmacy from now on. Pt stated that a refill request was sent to the office from the pharmacy but hasn't received a response as of now. Pt provided the order number ([id number]) and would like for the office to get the refills approved. Pt would also like a callback with an update on this request.  Provider sent refills.

## 2024-05-03 NOTE — Progress Notes (Signed)
 Complex Care Management Note Care Guide Note  05/03/2024 Name: Casey Frazier MRN: [id number] DOB: Feb 19, 1956   Complex Care Management Outreach Attempts: A third unsuccessful outreach was attempted today to offer the patient with information about available complex care management services.  Follow Up Plan:  No further outreach attempts will be made at this time. We have been unable to contact the patient to offer or enroll patient in complex care management services.  Encounter Outcome:  No Answer  Dreama [facility]  [facility], [facility] VBCI Assistant Direct Dial: [phone]  Fax: [phone];

## 2024-05-05 ENCOUNTER — Ambulatory Visit (HOSPITAL_BASED_OUTPATIENT_CLINIC_OR_DEPARTMENT_OTHER)

## 2024-05-05 ENCOUNTER — Encounter (HOSPITAL_BASED_OUTPATIENT_CLINIC_OR_DEPARTMENT_OTHER): Payer: Self-pay

## 2024-05-16 ENCOUNTER — Other Ambulatory Visit: Payer: Self-pay | Admitting: Family Medicine

## 2024-05-16 DIAGNOSIS — I10 Essential (primary) hypertension: Secondary | ICD-10-CM

## 2024-05-17 NOTE — Progress Notes (Signed)
 Complex Care Management Note  Care Guide Note 05/17/2024 Name: Casey Frazier MRN: [id number] DOB: June 26, 1956  Casey Frazier is a 68 y.o. year old female who sees Frann, Mabel Mt, DO for primary care. I reached out to Dickey LITTIE Pepper by phone today to offer complex care management services.  Ms. Pyatt was given information about Complex Care Management services today including:   The Complex Care Management services include support from the care team which includes your Nurse Care Manager, Clinical Social Worker, or Pharmacist.  The Complex Care Management team is here to help remove barriers to the health concerns and goals most important to you. Complex Care Management services are voluntary, and the patient may decline or stop services at any time by request to their care team member.   Complex Care Management Consent Status: Patient did not agree to participate in complex care management services at this time.  Encounter Outcome:  Patient Refused  Dreama Agent [facility], [facility] VBCI Assistant Direct Dial: [phone]  Fax: [phone]

## 2024-05-27 ENCOUNTER — Other Ambulatory Visit: Payer: Self-pay | Admitting: Family Medicine

## 2024-05-27 DIAGNOSIS — F339 Major depressive disorder, recurrent, unspecified: Secondary | ICD-10-CM

## 2024-06-11 ENCOUNTER — Telehealth: Payer: Self-pay

## 2024-06-11 NOTE — Telephone Encounter (Signed)
 Copied from CRM #[id number]. Topic: Referral - Status >> Jun 11, 2024 11:37 AM Harlene ORN wrote: Reason for CRM:  Casey Frazier - [facility]  received a referral from her PCP and they are not accepting outside referrals at this time.  Phone: [phone]

## 2024-06-18 ENCOUNTER — Telehealth: Payer: Self-pay

## 2024-06-18 ENCOUNTER — Other Ambulatory Visit: Payer: Self-pay

## 2024-06-18 DIAGNOSIS — F339 Major depressive disorder, recurrent, unspecified: Secondary | ICD-10-CM

## 2024-06-18 MED ORDER — VENLAFAXINE HCL ER 150 MG PO CP24: 150.0000 mg | ORAL_CAPSULE | Freq: Every day | ORAL | 0 refills | Status: DC

## 2024-06-18 NOTE — Telephone Encounter (Signed)
 Copied from CRM #[id number]. Topic: Clinical - Medication Question >> Jun 18, 2024 12:33 PM Frederich PARAS wrote: Reason for CRM: pt  medication  venlafaxine  150mg  4hr capsule, the medicine has been shipped out but it will not arrive until Friday or Saturday/. Pt wants to know if she can get a 3-4 day supply to last until the medication is delivered.

## 2024-06-18 NOTE — Telephone Encounter (Signed)
 Error CRM sent, waiting to see what local pharmacy Pt wants it sent to.

## 2024-06-18 NOTE — Telephone Encounter (Signed)
 Called pt lvm @ medication venlafaxine  150mg  4hr capsule, ask her to let us  know what local pharmacy she like to use to get a refill until her mail order comes.

## 2024-06-18 NOTE — Telephone Encounter (Signed)
 Reached out to pt to confirm where she would like short supply to go to or what pharmacy.

## 2024-06-29 ENCOUNTER — Other Ambulatory Visit: Payer: Self-pay | Admitting: Family Medicine

## 2024-06-29 DIAGNOSIS — I10 Essential (primary) hypertension: Secondary | ICD-10-CM

## 2024-07-04 ENCOUNTER — Other Ambulatory Visit: Payer: Self-pay

## 2024-07-04 DIAGNOSIS — F339 Major depressive disorder, recurrent, unspecified: Secondary | ICD-10-CM

## 2024-07-04 MED ORDER — VENLAFAXINE HCL ER 150 MG PO CP24: 150.0000 mg | ORAL_CAPSULE | Freq: Every day | ORAL | 0 refills | Status: DC

## 2024-07-08 ENCOUNTER — Encounter: Payer: Self-pay | Admitting: Family Medicine

## 2024-07-08 ENCOUNTER — Other Ambulatory Visit: Payer: Self-pay | Admitting: Family Medicine

## 2024-07-08 DIAGNOSIS — I10 Essential (primary) hypertension: Secondary | ICD-10-CM

## 2024-07-08 DIAGNOSIS — Z1231 Encounter for screening mammogram for malignant neoplasm of breast: Secondary | ICD-10-CM

## 2024-07-12 ENCOUNTER — Ambulatory Visit
Admission: RE | Admit: 2024-07-12 | Discharge: 2024-07-12 | Disposition: A | Discharge status: Home / Self Care | Source: Ambulatory Visit | Attending: Family Medicine | Admitting: Family Medicine

## 2024-07-12 ENCOUNTER — Ambulatory Visit

## 2024-07-12 DIAGNOSIS — Z1231 Encounter for screening mammogram for malignant neoplasm of breast: Secondary | ICD-10-CM

## 2024-08-12 ENCOUNTER — Other Ambulatory Visit: Payer: Self-pay | Admitting: Family Medicine

## 2024-08-12 DIAGNOSIS — I10 Essential (primary) hypertension: Secondary | ICD-10-CM

## 2024-09-08 ENCOUNTER — Other Ambulatory Visit: Payer: Self-pay | Admitting: Family Medicine

## 2024-09-08 DIAGNOSIS — F339 Major depressive disorder, recurrent, unspecified: Secondary | ICD-10-CM

## 2025-04-15 ENCOUNTER — Ambulatory Visit
